# Patient Record
Sex: Male | Born: 1940 | Race: White | Hispanic: No | Marital: Married | Smoking: Former smoker
Health system: Southern US, Community
[De-identification: ages and names within clinical notes are randomized; demographics above are authoritative.]

## PROBLEM LIST (undated history)

## (undated) DIAGNOSIS — F419 Anxiety disorder, unspecified: Secondary | ICD-10-CM

## (undated) DIAGNOSIS — N189 Chronic kidney disease, unspecified: Secondary | ICD-10-CM

## (undated) DIAGNOSIS — C801 Malignant (primary) neoplasm, unspecified: Secondary | ICD-10-CM

## (undated) DIAGNOSIS — N400 Enlarged prostate without lower urinary tract symptoms: Secondary | ICD-10-CM

## (undated) DIAGNOSIS — F32A Depression, unspecified: Secondary | ICD-10-CM

## (undated) DIAGNOSIS — E785 Hyperlipidemia, unspecified: Secondary | ICD-10-CM

## (undated) DIAGNOSIS — K219 Gastro-esophageal reflux disease without esophagitis: Secondary | ICD-10-CM

## (undated) DIAGNOSIS — I10 Essential (primary) hypertension: Secondary | ICD-10-CM

## (undated) DIAGNOSIS — F329 Major depressive disorder, single episode, unspecified: Secondary | ICD-10-CM

## (undated) DIAGNOSIS — M1990 Unspecified osteoarthritis, unspecified site: Secondary | ICD-10-CM

## (undated) DIAGNOSIS — R55 Syncope and collapse: Secondary | ICD-10-CM

## (undated) HISTORY — PX: ESOPHAGEAL DILATION: SHX303

## (undated) HISTORY — DX: Malignant (primary) neoplasm, unspecified: C80.1

## (undated) HISTORY — PX: HERNIA REPAIR: SHX51

## (undated) HISTORY — PX: UPPER GASTROINTESTINAL ENDOSCOPY: SHX188

## (undated) HISTORY — PX: CHOLECYSTECTOMY: SHX55

## (undated) HISTORY — DX: Chronic kidney disease, unspecified: N18.9

## (undated) HISTORY — PX: POLYPECTOMY: SHX149

## (undated) HISTORY — DX: Anxiety disorder, unspecified: F41.9

## (undated) HISTORY — DX: Syncope and collapse: R55

## (undated) HISTORY — DX: Unspecified osteoarthritis, unspecified site: M19.90

## (undated) HISTORY — DX: Depression, unspecified: F32.A

---
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
## 1984-09-03 HISTORY — PX: KIDNEY STONE SURGERY: SHX686

## 2017-10-16 HISTORY — PX: ESOPHAGOGASTRODUODENOSCOPY: SHX1529

## 2017-12-20 ENCOUNTER — Encounter (HOSPITAL_COMMUNITY): Payer: Self-pay | Admitting: Internal Medicine

## 2017-12-20 ENCOUNTER — Emergency Department (HOSPITAL_COMMUNITY): Payer: Medicare Other

## 2017-12-20 ENCOUNTER — Inpatient Hospital Stay (HOSPITAL_COMMUNITY)
Admission: EM | Admit: 2017-12-20 | Discharge: 2017-12-22 | DRG: 641 | Disposition: A | Discharge status: Home / Self Care | Payer: Medicare Other | Attending: Family Medicine | Admitting: Family Medicine

## 2017-12-20 ENCOUNTER — Other Ambulatory Visit: Payer: Self-pay

## 2017-12-20 DIAGNOSIS — E860 Dehydration: Principal | ICD-10-CM | POA: Diagnosis present

## 2017-12-20 DIAGNOSIS — R197 Diarrhea, unspecified: Secondary | ICD-10-CM | POA: Diagnosis not present

## 2017-12-20 DIAGNOSIS — N400 Enlarged prostate without lower urinary tract symptoms: Secondary | ICD-10-CM | POA: Diagnosis present

## 2017-12-20 DIAGNOSIS — Z87442 Personal history of urinary calculi: Secondary | ICD-10-CM

## 2017-12-20 DIAGNOSIS — K521 Toxic gastroenteritis and colitis: Secondary | ICD-10-CM | POA: Diagnosis not present

## 2017-12-20 DIAGNOSIS — Y92009 Unspecified place in unspecified non-institutional (private) residence as the place of occurrence of the external cause: Secondary | ICD-10-CM | POA: Diagnosis not present

## 2017-12-20 DIAGNOSIS — K219 Gastro-esophageal reflux disease without esophagitis: Secondary | ICD-10-CM | POA: Diagnosis present

## 2017-12-20 DIAGNOSIS — E871 Hypo-osmolality and hyponatremia: Secondary | ICD-10-CM | POA: Diagnosis not present

## 2017-12-20 DIAGNOSIS — R112 Nausea with vomiting, unspecified: Secondary | ICD-10-CM | POA: Diagnosis not present

## 2017-12-20 DIAGNOSIS — E876 Hypokalemia: Secondary | ICD-10-CM | POA: Diagnosis not present

## 2017-12-20 DIAGNOSIS — I10 Essential (primary) hypertension: Secondary | ICD-10-CM | POA: Diagnosis present

## 2017-12-20 DIAGNOSIS — A770 Spotted fever due to Rickettsia rickettsii: Secondary | ICD-10-CM | POA: Diagnosis not present

## 2017-12-20 DIAGNOSIS — R55 Syncope and collapse: Secondary | ICD-10-CM | POA: Diagnosis not present

## 2017-12-20 DIAGNOSIS — Z9049 Acquired absence of other specified parts of digestive tract: Secondary | ICD-10-CM | POA: Diagnosis not present

## 2017-12-20 DIAGNOSIS — T364X5A Adverse effect of tetracyclines, initial encounter: Secondary | ICD-10-CM | POA: Diagnosis present

## 2017-12-20 DIAGNOSIS — Z87891 Personal history of nicotine dependence: Secondary | ICD-10-CM

## 2017-12-20 DIAGNOSIS — Z8249 Family history of ischemic heart disease and other diseases of the circulatory system: Secondary | ICD-10-CM

## 2017-12-20 HISTORY — DX: Benign prostatic hyperplasia without lower urinary tract symptoms: N40.0

## 2017-12-20 HISTORY — DX: Hyperlipidemia, unspecified: E78.5

## 2017-12-20 HISTORY — DX: Gastro-esophageal reflux disease without esophagitis: K21.9

## 2017-12-20 HISTORY — DX: Essential (primary) hypertension: I10

## 2017-12-20 LAB — CBC WITH DIFFERENTIAL/PLATELET
Basophils Absolute: 0 10*3/uL (ref 0.0–0.1)
Basophils Relative: 0 %
EOS PCT: 0 %
Eosinophils Absolute: 0 10*3/uL (ref 0.0–0.7)
HEMATOCRIT: 35.5 % — AB (ref 39.0–52.0)
HEMOGLOBIN: 12.5 g/dL — AB (ref 13.0–17.0)
LYMPHS ABS: 0.4 10*3/uL — AB (ref 0.7–4.0)
LYMPHS PCT: 3 %
MCH: 30.6 pg (ref 26.0–34.0)
MCHC: 35.2 g/dL (ref 30.0–36.0)
MCV: 87 fL (ref 78.0–100.0)
Monocytes Absolute: 0.5 10*3/uL (ref 0.1–1.0)
Monocytes Relative: 4 %
NEUTROS ABS: 11.4 10*3/uL — AB (ref 1.7–7.7)
Neutrophils Relative %: 93 %
PLATELETS: 194 10*3/uL (ref 150–400)
RBC: 4.08 MIL/uL — AB (ref 4.22–5.81)
RDW: 12.1 % (ref 11.5–15.5)
WBC: 12.3 10*3/uL — AB (ref 4.0–10.5)

## 2017-12-20 LAB — COMPREHENSIVE METABOLIC PANEL
ALBUMIN: 3.2 g/dL — AB (ref 3.5–5.0)
ALT: 17 U/L (ref 17–63)
ANION GAP: 11 (ref 5–15)
AST: 20 U/L (ref 15–41)
Alkaline Phosphatase: 47 U/L (ref 38–126)
BUN: 13 mg/dL (ref 6–20)
CHLORIDE: 100 mmol/L — AB (ref 101–111)
CO2: 18 mmol/L — ABNORMAL LOW (ref 22–32)
Calcium: 7.8 mg/dL — ABNORMAL LOW (ref 8.9–10.3)
Creatinine, Ser: 0.91 mg/dL (ref 0.61–1.24)
GFR calc Af Amer: >60 mL/min (ref >60)
Glucose, Bld: 156 mg/dL — ABNORMAL HIGH (ref 65–99)
POTASSIUM: 3 mmol/L — AB (ref 3.5–5.1)
Sodium: 129 mmol/L — ABNORMAL LOW (ref 135–145)
Total Bilirubin: 1.5 mg/dL — ABNORMAL HIGH (ref 0.3–1.2)
Total Protein: 5.5 g/dL — ABNORMAL LOW (ref 6.5–8.1)

## 2017-12-20 LAB — MAGNESIUM: Magnesium: 1.5 mg/dL — ABNORMAL LOW (ref 1.7–2.4)

## 2017-12-20 LAB — I-STAT TROPONIN, ED: TROPONIN I, POC: 0 ng/mL (ref 0.00–0.08)

## 2017-12-20 LAB — POC OCCULT BLOOD, ED: FECAL OCCULT BLD: NEGATIVE

## 2017-12-20 MED ORDER — PANTOPRAZOLE SODIUM 40 MG PO TBEC
40.0000 mg | DELAYED_RELEASE_TABLET | Freq: Every day | ORAL | Status: DC
Start: 2017-12-21 — End: 2017-12-22
  Administered 2017-12-21 – 2017-12-22 (×2): 40 mg via ORAL
  Filled 2017-12-20 (×2): qty 1

## 2017-12-20 MED ORDER — METOCLOPRAMIDE HCL 5 MG/ML IJ SOLN
5.0000 mg | Freq: Once | INTRAMUSCULAR | Status: AC
  Administered 2017-12-20: 5 mg via INTRAVENOUS
  Filled 2017-12-20: qty 2

## 2017-12-20 MED ORDER — ENOXAPARIN SODIUM 40 MG/0.4ML ~~LOC~~ SOLN
40.0000 mg | SUBCUTANEOUS | Status: DC
  Administered 2017-12-20 – 2017-12-21 (×2): 40 mg via SUBCUTANEOUS
  Filled 2017-12-20 (×3): qty 0.4

## 2017-12-20 MED ORDER — SODIUM CHLORIDE 0.9 % IV SOLN
100.0000 mg | Freq: Two times a day (BID) | INTRAVENOUS | Status: DC
  Administered 2017-12-20 – 2017-12-22 (×4): 100 mg via INTRAVENOUS
  Filled 2017-12-20 (×5): qty 100

## 2017-12-20 MED ORDER — SODIUM CHLORIDE 0.9 % IV BOLUS
2000.0000 mL | Freq: Once | INTRAVENOUS | Status: AC
Start: 2017-12-20 — End: 2017-12-20
  Administered 2017-12-20: 2000 mL via INTRAVENOUS

## 2017-12-20 MED ORDER — PROMETHAZINE HCL 25 MG/ML IJ SOLN
12.5000 mg | Freq: Three times a day (TID) | INTRAMUSCULAR | Status: DC | PRN
  Filled 2017-12-20: qty 1

## 2017-12-20 MED ORDER — AMLODIPINE BESYLATE 10 MG PO TABS
10.0000 mg | ORAL_TABLET | Freq: Every day | ORAL | Status: DC
  Administered 2017-12-21 – 2017-12-22 (×2): 10 mg via ORAL
  Filled 2017-12-20 (×2): qty 1

## 2017-12-20 MED ORDER — LORAZEPAM 2 MG/ML IJ SOLN: 1.0000 mg | Freq: Once | INTRAMUSCULAR | Status: DC

## 2017-12-20 MED ORDER — SODIUM CHLORIDE 0.9% FLUSH
3.0000 mL | Freq: Two times a day (BID) | INTRAVENOUS | Status: DC
  Administered 2017-12-21 – 2017-12-22 (×3): 3 mL via INTRAVENOUS

## 2017-12-20 MED ORDER — DUTASTERIDE 0.5 MG PO CAPS
0.5000 mg | ORAL_CAPSULE | Freq: Every day | ORAL | Status: DC
  Administered 2017-12-21 – 2017-12-22 (×2): 0.5 mg via ORAL
  Filled 2017-12-20 (×2): qty 1

## 2017-12-20 MED ORDER — POTASSIUM CHLORIDE CRYS ER 20 MEQ PO TBCR
40.0000 meq | EXTENDED_RELEASE_TABLET | Freq: Once | ORAL | Status: AC
  Administered 2017-12-20: 40 meq via ORAL
  Filled 2017-12-20: qty 2

## 2017-12-20 MED ORDER — SODIUM CHLORIDE 0.9 % IV SOLN
INTRAVENOUS | Status: DC
  Administered 2017-12-20 – 2017-12-22 (×4): via INTRAVENOUS

## 2017-12-20 MED ORDER — ONDANSETRON HCL 4 MG/2ML IJ SOLN
4.0000 mg | Freq: Four times a day (QID) | INTRAMUSCULAR | Status: DC | PRN
  Administered 2017-12-21: 4 mg via INTRAVENOUS
  Filled 2017-12-20 (×2): qty 2

## 2017-12-20 NOTE — ED Notes (Signed)
Patient transported to X-ray 

## 2017-12-20 NOTE — Progress Notes (Signed)
Patient arrived to unit, wife at bedside. Patient complains of nausea and also feeling hungry. Gave patient snacks according to clear liquid diet. Patient on high/loe bed for safety. Updated on plan of care and safety measures in place.

## 2017-12-20 NOTE — ED Triage Notes (Signed)
Pt arived via [employer].Marland KitchenMarland Kitchen

## 2017-12-20 NOTE — ED Notes (Signed)
Patient transported to CT 

## 2017-12-20 NOTE — ED Notes (Signed)
Label sent to lab to run mg blood test

## 2017-12-20 NOTE — ED Provider Notes (Signed)
[facility] EMERGENCY DEPARTMENT Provider Note   CSN: [id number] Arrival date & time: 12/20/17  1506     History   Chief Complaint Chief Complaint  Patient presents with  . Shortness of Breath    HPI Zohaib Aquilino is a 77 y.o. male.  Level 5 caveat patient is extremely poor historian history is obtained from EMS and from patient's wife via phone  HPI patient complains of extreme dizziness meaning lightheadedness he has had one syncopal event and 2 near syncopal events today.  His wife reports that he has had diarrhea and vomiting for the past 2 days vomiting at least 5 times today and 3 episodes of diarrhea.  He complains of headache, diffuse presently which is worse with changing positions.  No known fever.  He was seen at [facility] emergency department this past week(day unclear the patient's wife and to pt) for dizziness and lightheadedness found to be dehydrated received treated with intravenous fluids.  Patient brought by EMS.  He had syncopal event in presence of EMS EMS performed rhythm strip which they determined to be ventricular tachycardia however rhythm strip appears to be artifact, with poor baseline and likely narrow complex bradycardia.  He was treated with 1 L of normal saline and received Zofran intravenously in the field.  His wife reports that he was seen by his primary care physician this past week as well and started on doxycycline for [facility] spotted fever.  He started doxycycline yesterday  No past medical history on file.  There are no active problems to display for this patient. Past medical history of hiatal hernia  Surgical history esophageal dilatation      Home Medications   Medications atenolol every 3 days, doxycycline Prior to Admission medications   Not on File    Family History No family history on file.  Social History Social History   Tobacco Use  . Smoking status: Not on file  Substance Use Topics  .  Alcohol use: Not on file  . Drug use: Not on file   Social history no tobacco no alcohol no drugs Allergies   Patient has no allergy information on record. No known drug allergies  Review of Systems Review of Systems  Unable to perform ROS: Other  Respiratory: Positive for shortness of breath.        Tachypnea  Gastrointestinal: Positive for diarrhea and vomiting.  Neurological: Positive for light-headedness and headaches.     Physical Exam Updated Vital Signs BP (!) 125/58   Pulse 81   Resp (!) 29   SpO2 100%   Physical Exam  Constitutional:  Ill-appearing very anxious  HENT:  Head: Normocephalic and atraumatic.  Mucous membranes dry  Eyes: Pupils are equal, round, and reactive to light. Conjunctivae are normal.  Neck: Neck supple. No tracheal deviation present. No thyromegaly present.  Cardiovascular: Normal rate and regular rhythm.  No murmur heard. Pulmonary/Chest: Effort normal and breath sounds normal.  Abdominal: Soft. Bowel sounds are normal. He exhibits no distension. There is no tenderness.  Genitourinary: Rectum normal and penis normal. Rectal exam shows guaiac negative stool.  Genitourinary Comments: Rectum normal tone brown stool no gross blood  Musculoskeletal: Normal range of motion. He exhibits no edema or tenderness.  Neurological: He is alert. Coordination normal.  Moves all extremities follows simple commands  Skin: Skin is warm and dry. No rash noted.  Psychiatric: He has a normal mood and affect.  Nursing note and vitals reviewed.  ED Treatments / Results  Labs (all labs ordered are listed, but only abnormal results are displayed) Labs Reviewed  COMPREHENSIVE METABOLIC PANEL  CBC WITH DIFFERENTIAL/PLATELET  POC OCCULT BLOOD, ED   Results for orders placed or performed during the hospital encounter of 12/20/17  Comprehensive metabolic panel  Result Value Ref Range   Sodium 129 (L) 135 - 145 mmol/L   Potassium 3.0 (L) 3.5 - 5.1 mmol/L     Chloride 100 (L) 101 - 111 mmol/L   CO2 18 (L) 22 - 32 mmol/L   Glucose, Bld 156 (H) 65 - 99 mg/dL   BUN 13 6 - 20 mg/dL   Creatinine, Ser 0.91 0.61 - 1.24 mg/dL   Calcium 7.8 (L) 8.9 - 10.3 mg/dL   Total Protein 5.5 (L) 6.5 - 8.1 g/dL   Albumin 3.2 (L) 3.5 - 5.0 g/dL   AST 20 15 - 41 U/L   ALT 17 17 - 63 U/L   Alkaline Phosphatase 47 38 - 126 U/L   Total Bilirubin 1.5 (H) 0.3 - 1.2 mg/dL   GFR calc non Af Amer >60 >60 mL/min   GFR calc Af Amer >60 >60 mL/min   Anion gap 11 5 - 15  CBC with Differential/Platelet  Result Value Ref Range   WBC 12.3 (H) 4.0 - 10.5 K/uL   RBC 4.08 (L) 4.22 - 5.81 MIL/uL   Hemoglobin 12.5 (L) 13.0 - 17.0 g/dL   HCT 35.5 (L) 39.0 - 52.0 %   MCV 87.0 78.0 - 100.0 fL   MCH 30.6 26.0 - 34.0 pg   MCHC 35.2 30.0 - 36.0 g/dL   RDW 12.1 11.5 - 15.5 %   Platelets 194 150 - 400 K/uL   Neutrophils Relative % 93 %   Neutro Abs 11.4 (H) 1.7 - 7.7 K/uL   Lymphocytes Relative 3 %   Lymphs Abs 0.4 (L) 0.7 - 4.0 K/uL   Monocytes Relative 4 %   Monocytes Absolute 0.5 0.1 - 1.0 K/uL   Eosinophils Relative 0 %   Eosinophils Absolute 0.0 0.0 - 0.7 K/uL   Basophils Relative 0 %   Basophils Absolute 0.0 0.0 - 0.1 K/uL  POC occult blood, ED Provider will collect  Result Value Ref Range   Fecal Occult Bld NEGATIVE NEGATIVE  I-stat troponin, ED  Result Value Ref Range   Troponin i, poc 0.00 0.00 - 0.08 ng/mL   Comment 3           Ct Head Wo Contrast  Result Date: 12/20/2017 CLINICAL DATA:  Syncope with a fall and blow to the head today. Initial encounter. EXAM: CT HEAD WITHOUT CONTRAST TECHNIQUE: Contiguous axial images were obtained from the base of the skull through the vertex without intravenous contrast. COMPARISON:  None. FINDINGS: Brain: No evidence of acute infarction, hemorrhage, hydrocephalus, extra-axial collection or mass lesion/mass effect. Vascular: No hyperdense vessel or unexpected calcification. Skull: Intact. Sinuses/Orbits: Negative. Other: None.  IMPRESSION: Normal head CT. Electronically Signed   By: Inge Rise M.D.   On: 12/20/2017 18:10   Dg Abd Acute W/chest  Result Date: 12/20/2017 CLINICAL DATA:  Vomiting, diarrhea, nausea. EXAM: DG ABDOMEN ACUTE W/ 1V CHEST COMPARISON:  None. FINDINGS: There is no evidence of dilated bowel loops or free intraperitoneal air. No radiopaque calculi or other significant radiographic abnormality is seen. Heart size and mediastinal contours are within normal limits. Both lungs are clear. IMPRESSION: No evidence of bowel obstruction or ileus. No acute cardiopulmonary disease. Electronically Signed  By: Marijo Conception, M.D.   On: 12/20/2017 16:47   EKG None  EKG Interpretation  Date/Time:  Friday December 20 2017 15:05:36 EDT Ventricular Rate:  80 PR Interval:    QRS Duration: 96 QT Interval:  435 QTC Calculation: 502 R Axis:   56 Text Interpretation:  Age not entered, assumed to be  77 years old for purpose of ECG interpretation Sinus rhythm Minimal ST depression, anterolateral leads Prolonged QT interval No old tracing to compare Confirmed by [place], Inocente Salles [phone]) on 12/20/2017 3:49:17 PM       Radiology No results found.  Procedures Procedures (including critical care time)  Medications Ordered in ED Medications  sodium chloride 0.9 % bolus 2,000 mL (has no administration in time range)  metoCLOPramide (REGLAN) injection 5 mg (has no administration in time range)     Initial Impression / Assessment and Plan / ED Course  I have reviewed the triage vital signs and the nursing notes.  Pertinent labs & imaging results that were available during my care of the patient were reviewed by me and considered in my medical decision making (see chart for details).   6P.m. he feels no improvement of nausea after with intravenous fluids and IV Reglan.  He now complains of severe sensation of room spinning and nausea worse when he moves his head.  IV lorazepam ordered.  7:10 PM he feels much  improved without further treatment.  He was not administered IV lorazepam.  He is alert and talkative. I suspect the patient had syncopal event secondary to dehydration and possibly vasovagal event.  His wife reports that he has syncopal events frequently, particularly when he vomits.  Vertigo appears to be peripheral.  Worse with moving his head with severe nausea.  No visual changes no speech changes.  Lab work is consistent with hypokalemia, and hyponatremia.  Consulted hospitalist  physician Dr. Alcario Drought  to arrange for overnight stay.  Dr. Alcario Drought will evaluate patient in the emergency department Final Clinical Impressions(s) / ED Diagnoses  Diagnoses #1 syncope #2 bad headache  #3 vertigo #4 nausea vomiting diarrhea #5 hyponatremia #6 hypokalemia  Final diagnoses:  None    ED Discharge Orders    None       Orlie Dakin, MD 12/20/17 2004

## 2017-12-20 NOTE — H&P (Signed)
History and Physical    Mason Stark BMW:[id number] DOB: [birth year] DOA: 12/20/2017  PCP: No primary care provider on file.  Patient coming from: Home  I have personally briefly reviewed patient's old medical records in [place]  Chief Complaint: Syncope, N/V/D  HPI: Mason Stark is a 77 y.o. male with medical history significant of HTN, HLD.  Had sinus headache and dizziness.   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  Started on doxycycline on 4/18, took first pill without problem but then developed N/V/D.  Today had syncopal episode at home and 2x near syncope.  Threw up third pill this AM.  Felt "flushed" but never had documented fever.  Has been bitten by a number of ticks throughout the years and gotten "injections" prophylacticly after bites.  But no tick bite he knows of in the past couple of weeks.  ED Course: ? borderline LVH on EKG.  CXR / KUB nl.  WBC 12.6k.  Sodium 129, K 3.0, CO2 18.  Given 2L NS and Reglan.  Hospitalist asked to obs.   Review of Systems: As per HPI otherwise 10 point review of systems negative.   Past Medical History:  Diagnosis Date  . BPH (benign prostatic hyperplasia)   . GERD (gastroesophageal reflux disease)   . HLD (hyperlipidemia)   . HTN (hypertension)     Past Surgical History:  Procedure Laterality Date  . CHOLECYSTECTOMY    . ESOPHAGEAL DILATION    . HERNIA REPAIR    . KIDNEY STONE SURGERY  2725   complications, ureteral injury, multiple surgeries were required     reports that he has quit smoking. He does not have any smokeless tobacco history on file. He reports that he does not drink alcohol or use drugs.  Allergies not on file  Family History  Problem Relation Age of Onset  . Heart failure Mother      Prior to Admission medications   Not on File    Physical Exam: Vitals:   12/20/17 1515 12/20/17 1530 12/20/17 1533 12/20/17 1600  BP: (!) 118/58 (!) 125/58  (!) 126/59  Pulse: 76 81   75  Resp: (!) 23 (!) 29  (!) 22  Temp:   97.6 F (36.4 C)   TempSrc:   Rectal   SpO2: 100% 100%  100%    Constitutional: NAD, calm, comfortable Eyes: PERRL, lids and conjunctivae normal ENMT: Mucous membranes are moist. Posterior pharynx clear of any exudate or lesions.Normal dentition.  Neck: normal, supple, no masses, no thyromegaly Respiratory: clear to auscultation bilaterally, no wheezing, no crackles. Normal respiratory effort. No accessory muscle use.  Cardiovascular: Regular rate and rhythm, no murmurs / rubs / gallops. No extremity edema. 2+ pedal pulses. No carotid bruits.  Abdomen: no tenderness, no masses palpated. No hepatosplenomegaly. Bowel sounds positive.  Musculoskeletal: no clubbing / cyanosis. No joint deformity upper and lower extremities. Good ROM, no contractures. Normal muscle tone.  Skin: no rashes, lesions, ulcers. No induration Neurologic: CN 2-12 grossly intact. Sensation intact, DTR normal. Strength 5/5 in all 4.  Psychiatric: Normal judgment and insight. Alert and oriented x 3. Normal mood.    Labs on Admission: I have personally reviewed following labs and imaging studies  CBC: Recent Labs  Lab 12/20/17 1733  WBC 12.3*  NEUTROABS 11.4*  HGB 12.5*  HCT 35.5*  MCV 87.0  PLT 366   Basic Metabolic Panel: Recent Labs  Lab 12/20/17 1733  NA 129*  K  3.0*  CL 100*  CO2 18*  GLUCOSE 156*  BUN 13  CREATININE 0.91  CALCIUM 7.8*   GFR: CrCl cannot be calculated (Unknown ideal weight.). Liver Function Tests: Recent Labs  Lab 12/20/17 1733  AST 20  ALT 17  ALKPHOS 47  BILITOT 1.5*  PROT 5.5*  ALBUMIN 3.2*   No results for input(s): LIPASE, AMYLASE in the last 168 hours. No results for input(s): AMMONIA in the last 168 hours. Coagulation Profile: No results for input(s): INR, PROTIME in the last 168 hours. Cardiac Enzymes: No results for input(s): CKTOTAL, CKMB, CKMBINDEX, TROPONINI in the last 168 hours. BNP (last 3 results) No  results for input(s): PROBNP in the last 8760 hours. HbA1C: No results for input(s): HGBA1C in the last 72 hours. CBG: No results for input(s): GLUCAP in the last 168 hours. Lipid Profile: No results for input(s): CHOL, HDL, LDLCALC, TRIG, CHOLHDL, LDLDIRECT in the last 72 hours. Thyroid Function Tests: No results for input(s): TSH, T4TOTAL, FREET4, T3FREE, THYROIDAB in the last 72 hours. Anemia Panel: No results for input(s): VITAMINB12, FOLATE, FERRITIN, TIBC, IRON, RETICCTPCT in the last 72 hours. Urine analysis: No results found for: COLORURINE, APPEARANCEUR, [place], [place], GLUCOSEU, HGBUR, BILIRUBINUR, KETONESUR, PROTEINUR, UROBILINOGEN, NITRITE, LEUKOCYTESUR  Radiological Exams on Admission: Ct Head Wo Contrast  Result Date: 12/20/2017 CLINICAL DATA:  Syncope with a fall and blow to the head today. Initial encounter. EXAM: CT HEAD WITHOUT CONTRAST TECHNIQUE: Contiguous axial images were obtained from the base of the skull through the vertex without intravenous contrast. COMPARISON:  None. FINDINGS: Brain: No evidence of acute infarction, hemorrhage, hydrocephalus, extra-axial collection or mass lesion/mass effect. Vascular: No hyperdense vessel or unexpected calcification. Skull: Intact. Sinuses/Orbits: Negative. Other: None. IMPRESSION: Normal head CT. Electronically Signed   By: Mason Stark M.D.   On: 12/20/2017 18:10   Dg Abd Acute W/chest  Result Date: 12/20/2017 CLINICAL DATA:  Vomiting, diarrhea, nausea. EXAM: DG ABDOMEN ACUTE W/ 1V CHEST COMPARISON:  None. FINDINGS: There is no evidence of dilated bowel loops or free intraperitoneal air. No radiopaque calculi or other significant radiographic abnormality is seen. Heart size and mediastinal contours are within normal limits. Both lungs are clear. IMPRESSION: No evidence of bowel obstruction or ileus. No acute cardiopulmonary disease. Electronically Signed   By: Mason Stark, M.D.   On: 12/20/2017 16:47    EKG:  Independently reviewed.  Assessment/Plan Principal Problem:   Syncope Active Problems:   Nausea vomiting and diarrhea   RMSF [facility] spotted fever)    1. Syncope - suspect dehydration 1. Syncope obs pathway 2. Tele monitor 3. Given EKG ? LVH, will get 2D echo 4. Replace K and hydrate with IV NS 5. Repeat BMP in AM 2. N/V/D - 1. Suspect side effect from PO doxycycline 2. Anti-emetics 3. Will switch to IV doxy during hospital stay 3. ? RMSF - 1. Curb sided Dr. Baxter Flattery 1. Obligated to treat positive titer even though it may very well be an IgG that doc got and represent past infection. 2. Could send IgG / IgM but by the time they got back in a week his treatment course would be about done. 3. She does suspect N/V/D is side effect of Doxycycline 4. Recs pre-treating with anti-emetic, doing IV in hospital 5. Recs NOT taking on empty stomach and pre-treating with anti-emetic when he is ready for discharge. 2. Only alternative med is chloramphenicol, which is associated with higher treatment mortality than doxy as well. 3. Unable to get  records from PCP, they are closed for good Friday and wife couldn't get in touch with anyone.  DVT prophylaxis: Lovenox Code Status: Full Family Communication: Wife at bedside Disposition Plan: Home after admit Consults called: Curb sided Dr. Baxter Flattery as above Admission status: Place in [place], [place] Hospital[facility] Pager [phone]  If 7AM-7PM, please contact day team taking care of patient [email or web address] Password [facility]  12/20/2017, 8:35 PM

## 2017-12-20 NOTE — Plan of Care (Signed)
  Problem: Education: Goal: Knowledge of General Education information will improve Outcome: Progressing   Problem: Health Behavior/Discharge Planning: Goal: Ability to manage health-related needs will improve Outcome: Progressing   Problem: Clinical Measurements: Goal: Diagnostic test results will improve Outcome: Progressing   Problem: Pain Managment: Goal: General experience of comfort will improve Outcome: Progressing

## 2017-12-21 ENCOUNTER — Observation Stay (HOSPITAL_COMMUNITY): Payer: Medicare Other

## 2017-12-21 DIAGNOSIS — R112 Nausea with vomiting, unspecified: Secondary | ICD-10-CM | POA: Diagnosis not present

## 2017-12-21 DIAGNOSIS — Z8249 Family history of ischemic heart disease and other diseases of the circulatory system: Secondary | ICD-10-CM | POA: Diagnosis not present

## 2017-12-21 DIAGNOSIS — Z87442 Personal history of urinary calculi: Secondary | ICD-10-CM | POA: Diagnosis not present

## 2017-12-21 DIAGNOSIS — T364X5A Adverse effect of tetracyclines, initial encounter: Secondary | ICD-10-CM | POA: Diagnosis not present

## 2017-12-21 DIAGNOSIS — R55 Syncope and collapse: Secondary | ICD-10-CM | POA: Diagnosis present

## 2017-12-21 DIAGNOSIS — K521 Toxic gastroenteritis and colitis: Secondary | ICD-10-CM | POA: Diagnosis not present

## 2017-12-21 DIAGNOSIS — Y92009 Unspecified place in unspecified non-institutional (private) residence as the place of occurrence of the external cause: Secondary | ICD-10-CM | POA: Diagnosis not present

## 2017-12-21 DIAGNOSIS — Z9049 Acquired absence of other specified parts of digestive tract: Secondary | ICD-10-CM | POA: Diagnosis not present

## 2017-12-21 DIAGNOSIS — I10 Essential (primary) hypertension: Secondary | ICD-10-CM | POA: Diagnosis not present

## 2017-12-21 DIAGNOSIS — K219 Gastro-esophageal reflux disease without esophagitis: Secondary | ICD-10-CM | POA: Diagnosis not present

## 2017-12-21 DIAGNOSIS — N400 Enlarged prostate without lower urinary tract symptoms: Secondary | ICD-10-CM | POA: Diagnosis not present

## 2017-12-21 DIAGNOSIS — A770 Spotted fever due to Rickettsia rickettsii: Secondary | ICD-10-CM | POA: Diagnosis not present

## 2017-12-21 DIAGNOSIS — E876 Hypokalemia: Secondary | ICD-10-CM | POA: Diagnosis not present

## 2017-12-21 DIAGNOSIS — E871 Hypo-osmolality and hyponatremia: Secondary | ICD-10-CM | POA: Diagnosis not present

## 2017-12-21 DIAGNOSIS — Z87891 Personal history of nicotine dependence: Secondary | ICD-10-CM | POA: Diagnosis not present

## 2017-12-21 DIAGNOSIS — E860 Dehydration: Secondary | ICD-10-CM | POA: Diagnosis not present

## 2017-12-21 LAB — CBC
HCT: 34.6 % — ABNORMAL LOW (ref 39.0–52.0)
Hemoglobin: 12.2 g/dL — ABNORMAL LOW (ref 13.0–17.0)
MCH: 31.3 pg (ref 26.0–34.0)
MCHC: 35.3 g/dL (ref 30.0–36.0)
MCV: 88.7 fL (ref 78.0–100.0)
PLATELETS: 187 10*3/uL (ref 150–400)
RBC: 3.9 MIL/uL — ABNORMAL LOW (ref 4.22–5.81)
RDW: 12.6 % (ref 11.5–15.5)
WBC: 10.6 10*3/uL — AB (ref 4.0–10.5)

## 2017-12-21 LAB — BASIC METABOLIC PANEL
Anion gap: 8 (ref 5–15)
BUN: 10 mg/dL (ref 6–20)
CHLORIDE: 104 mmol/L (ref 101–111)
CO2: 21 mmol/L — AB (ref 22–32)
Calcium: 7.9 mg/dL — ABNORMAL LOW (ref 8.9–10.3)
Creatinine, Ser: 0.89 mg/dL (ref 0.61–1.24)
GFR calc non Af Amer: >60 mL/min (ref >60)
Glucose, Bld: 105 mg/dL — ABNORMAL HIGH (ref 65–99)
POTASSIUM: 3.1 mmol/L — AB (ref 3.5–5.1)
SODIUM: 133 mmol/L — AB (ref 135–145)

## 2017-12-21 LAB — GLUCOSE, CAPILLARY: GLUCOSE-CAPILLARY: 97 mg/dL (ref 65–99)

## 2017-12-21 MED ORDER — POTASSIUM CHLORIDE CRYS ER 20 MEQ PO TBCR
40.0000 meq | EXTENDED_RELEASE_TABLET | Freq: Once | ORAL | Status: AC
  Administered 2017-12-21: 40 meq via ORAL
  Filled 2017-12-21: qty 2

## 2017-12-21 MED ORDER — CALCIUM CARBONATE-VITAMIN D 500-200 MG-UNIT PO TABS
2.0000 | ORAL_TABLET | Freq: Every day | ORAL | Status: DC
  Administered 2017-12-21 – 2017-12-22 (×2): 2 via ORAL
  Filled 2017-12-21 (×2): qty 2

## 2017-12-21 MED ORDER — ACETAMINOPHEN 325 MG PO TABS
650.0000 mg | ORAL_TABLET | Freq: Four times a day (QID) | ORAL | Status: DC | PRN
  Filled 2017-12-21: qty 2

## 2017-12-21 NOTE — Progress Notes (Addendum)
Pt stable, vitals stable, sitting in a recliner without SOB and distress, will continue to monitor RN talked with MD regarding his abnormal electrolytes, K was replaced via oral, For Mg MD said to keep watching and will reevaluated via another lab work  [place], Therapist, sports

## 2017-12-21 NOTE — Progress Notes (Signed)
Patient and wife are upset about medications that were not ordered or given during day shift. Questioned night RN about receiving doxycyline- wondering if he would get it. Wife states she is very aggravated, speaking in loud volume, and visibly stressed. She states she has many complaints about patient not receiving his meds and communication between MD. RN gave night meds and tried to address other concerns.

## 2017-12-21 NOTE — Progress Notes (Signed)
PROGRESS NOTE  Mason Stark LFY:[id number] DOB: 18-Jun-1941 DOA: 12/20/2017 PCP: No primary care provider on file.  HPI/Recap of past 24 hours: Patient seen at bedside with his wife in the room.  Nausea and vomiting is improved he has not had any vomiting but still feeling nauseous and stated his has some epigastric burning he is supposed to be on PPI.  Still feels tired.  Assessment/Plan: Principal Problem:   Syncope Active Problems:   Nausea vomiting and diarrhea   RMSF [facility] spotted fever)   Code Status: full   Family Communication: wife  Disposition Plan: Change to inpatient since he is still having nausea most likely from the p.o. doxycycline and requiring IV doxycycline   Consultants:  none   Procedures: non Antimicrobials:  IV doxycycline  DVT prophylaxis: Lovenox   Objective: Vitals:   12/20/17 2201 12/20/17 2353 12/21/17 0347 12/21/17 1243  BP: 139/64 (!) 111/53 (!) 130/59 128/60  Pulse: 77 65 77 63  Resp: 18 18 18 20   Temp: 98.1 F (36.7 C) 98 F (36.7 C) 98.1 F (36.7 C) 98 F (36.7 C)  TempSrc: Oral Oral Oral Oral  SpO2: 99% 98% 98% 97%  Weight: 73.5 kg (162 lb)  73.4 kg (161 lb 12.8 oz)   Height: 5\' 8"  (1.727 m)       Intake/Output Summary (Last 24 hours) at 12/21/2017 1449 Last data filed at 12/21/2017 1300 Gross per 24 hour  Intake 4083.33 ml  Output 1500 ml  Net 2583.33 ml   Filed Weights   12/20/17 2201 12/21/17 0347  Weight: 73.5 kg (162 lb) 73.4 kg (161 lb 12.8 oz)   Body mass index is 24.6 kg/m.  Exam:   General: Well-nourished  Cardiovascular: Regular rate and rhythm no murmur  Respiratory: CTA   Abdomen: Soft nontender normal active bowel sounds, no hepatosplenomegaly  Musculoskeletal: Ambulatory  Skin: Warm and moist  Psychiatry: Alert and oriented x3, congruent   Data Reviewed: CBC: Recent Labs  Lab 12/20/17 1733 12/21/17 0456  WBC 12.3* 10.6*  NEUTROABS 11.4*  --   HGB 12.5* 12.2*  HCT 35.5*  34.6*  MCV 87.0 88.7  PLT 194 852   Basic Metabolic Panel: Recent Labs  Lab 12/20/17 1733 12/20/17 2006 12/21/17 0456  NA 129*  --  133*  K 3.0*  --  3.1*  CL 100*  --  104  CO2 18*  --  21*  GLUCOSE 156*  --  105*  BUN 13  --  10  CREATININE 0.91  --  0.89  CALCIUM 7.8*  --  7.9*  MG  --  1.5*  --    GFR: Estimated Creatinine Clearance: 69.4 mL/min (by C-G formula based on SCr of 0.89 mg/dL). Liver Function Tests: Recent Labs  Lab 12/20/17 1733  AST 20  ALT 17  ALKPHOS 47  BILITOT 1.5*  PROT 5.5*  ALBUMIN 3.2*   No results for input(s): LIPASE, AMYLASE in the last 168 hours. No results for input(s): AMMONIA in the last 168 hours. Coagulation Profile: No results for input(s): INR, PROTIME in the last 168 hours. Cardiac Enzymes: No results for input(s): CKTOTAL, CKMB, CKMBINDEX, TROPONINI in the last 168 hours. BNP (last 3 results) No results for input(s): PROBNP in the last 8760 hours. HbA1C: No results for input(s): HGBA1C in the last 72 hours. CBG: Recent Labs  Lab 12/21/17 0610  GLUCAP 97   Lipid Profile: No results for input(s): CHOL, HDL, LDLCALC, TRIG, CHOLHDL, LDLDIRECT in the last 72  hours. Thyroid Function Tests: No results for input(s): TSH, T4TOTAL, FREET4, T3FREE, THYROIDAB in the last 72 hours. Anemia Panel: No results for input(s): VITAMINB12, FOLATE, FERRITIN, TIBC, IRON, RETICCTPCT in the last 72 hours. Urine analysis: No results found for: COLORURINE, APPEARANCEUR, LABSPEC, PHURINE, GLUCOSEU, HGBUR, BILIRUBINUR, KETONESUR, PROTEINUR, UROBILINOGEN, NITRITE, LEUKOCYTESUR Sepsis Labs: @LABRCNTIP (procalcitonin:4,lacticidven:4)  )No results found for this or any previous visit (from the past 240 hour(s)).    Studies: Ct Head Wo Contrast  Result Date: 12/20/2017 CLINICAL DATA:  Syncope with a fall and blow to the head today. Initial encounter. EXAM: CT HEAD WITHOUT CONTRAST TECHNIQUE: Contiguous axial images were obtained from the base of  the skull through the vertex without intravenous contrast. COMPARISON:  None. FINDINGS: Brain: No evidence of acute infarction, hemorrhage, hydrocephalus, extra-axial collection or mass lesion/mass effect. Vascular: No hyperdense vessel or unexpected calcification. Skull: Intact. Sinuses/Orbits: Negative. Other: None. IMPRESSION: Normal head CT. Electronically Signed   By: Inge Rise M.D.   On: 12/20/2017 18:10   Dg Abd Acute W/chest  Result Date: 12/20/2017 CLINICAL DATA:  Vomiting, diarrhea, nausea. EXAM: DG ABDOMEN ACUTE W/ 1V CHEST COMPARISON:  None. FINDINGS: There is no evidence of dilated bowel loops or free intraperitoneal air. No radiopaque calculi or other significant radiographic abnormality is seen. Heart size and mediastinal contours are within normal limits. Both lungs are clear. IMPRESSION: No evidence of bowel obstruction or ileus. No acute cardiopulmonary disease. Electronically Signed   By: Marijo Conception, M.D.   On: 12/20/2017 16:47    Scheduled Meds: . amLODipine  10 mg Oral Daily  . calcium-vitamin D  2 tablet Oral Q breakfast  . dutasteride  0.5 mg Oral Daily  . enoxaparin (LOVENOX) injection  40 mg Subcutaneous Q24H  . pantoprazole  40 mg Oral Daily  . sodium chloride flush  3 mL Intravenous Q12H    Continuous Infusions: . sodium chloride 100 mL/hr at 12/21/17 0427  . doxycycline (VIBRAMYCIN) IV Stopped (12/21/17 1101)     LOS: 0 days   Assessment/Plan Principal Problem:   Syncope Active Problems:   Nausea vomiting and diarrhea   RMSF [facility] spotted fever)    1. Syncope - suspect dehydration 1. Syncope  2. Tele monitor 3. Given EKG ? LVH, will get 2D echo pending 4. Replace K and hydrate with IV NS 5. Repeat BMP in AM 2. N/V/D - 1. Suspect side effect from PO doxycycline 2. Anti-emetics 3. switched to IV doxy during hospital stay 3. ? RMSF - 1. Continue IV doxycycline.    Cristal Deer, MD [facility]  To reach me or  the doctor on call, go to: [email or web address] Password [facility]  12/21/2017, 2:49 PM

## 2017-12-22 ENCOUNTER — Inpatient Hospital Stay (HOSPITAL_COMMUNITY): Payer: Medicare Other

## 2017-12-22 DIAGNOSIS — E860 Dehydration: Secondary | ICD-10-CM | POA: Diagnosis not present

## 2017-12-22 DIAGNOSIS — R55 Syncope and collapse: Secondary | ICD-10-CM

## 2017-12-22 LAB — COMPREHENSIVE METABOLIC PANEL
ALBUMIN: 3.4 g/dL — AB (ref 3.5–5.0)
ALK PHOS: 53 U/L (ref 38–126)
ALT: 19 U/L (ref 17–63)
ANION GAP: 7 (ref 5–15)
AST: 44 U/L — AB (ref 15–41)
BUN: 7 mg/dL (ref 6–20)
CALCIUM: 8.6 mg/dL — AB (ref 8.9–10.3)
CO2: 23 mmol/L (ref 22–32)
CREATININE: 0.86 mg/dL (ref 0.61–1.24)
Chloride: 108 mmol/L (ref 101–111)
GFR calc Af Amer: >60 mL/min (ref >60)
GFR calc non Af Amer: >60 mL/min (ref >60)
GLUCOSE: 105 mg/dL — AB (ref 65–99)
Potassium: 3.9 mmol/L (ref 3.5–5.1)
Sodium: 138 mmol/L (ref 135–145)
Total Bilirubin: 1.3 mg/dL — ABNORMAL HIGH (ref 0.3–1.2)
Total Protein: 5.8 g/dL — ABNORMAL LOW (ref 6.5–8.1)

## 2017-12-22 LAB — GLUCOSE, CAPILLARY: Glucose-Capillary: 85 mg/dL (ref 65–99)

## 2017-12-22 LAB — CBC WITH DIFFERENTIAL/PLATELET
BASOS ABS: 0 10*3/uL (ref 0.0–0.1)
BASOS PCT: 0 %
EOS ABS: 0 10*3/uL (ref 0.0–0.7)
Eosinophils Relative: 1 %
HCT: 39.6 % (ref 39.0–52.0)
HEMOGLOBIN: 13.7 g/dL (ref 13.0–17.0)
Lymphocytes Relative: 19 %
Lymphs Abs: 1.3 10*3/uL (ref 0.7–4.0)
MCH: 31.4 pg (ref 26.0–34.0)
MCHC: 34.6 g/dL (ref 30.0–36.0)
MCV: 90.6 fL (ref 78.0–100.0)
Monocytes Absolute: 0.7 10*3/uL (ref 0.1–1.0)
Monocytes Relative: 10 %
NEUTROS PCT: 70 %
Neutro Abs: 5 10*3/uL (ref 1.7–7.7)
Platelets: 202 10*3/uL (ref 150–400)
RBC: 4.37 MIL/uL (ref 4.22–5.81)
RDW: 12.8 % (ref 11.5–15.5)
WBC: 7 10*3/uL (ref 4.0–10.5)

## 2017-12-22 LAB — ECHOCARDIOGRAM COMPLETE
HEIGHTINCHES: 68 in
Weight: 2563.2 oz

## 2017-12-22 MED ORDER — ONDANSETRON HCL 4 MG PO TABS: 4.0000 mg | ORAL_TABLET | Freq: Two times a day (BID) | ORAL | 0 refills | Status: AC | PRN

## 2017-12-22 MED ORDER — ONDANSETRON HCL 4 MG PO TABS: 4.0000 mg | ORAL_TABLET | Freq: Two times a day (BID) | ORAL | 0 refills | Status: DC | PRN

## 2017-12-22 NOTE — Progress Notes (Signed)
Pt got discharged to home, discharge instructions provided and patient showed understanding to it, IV taken out,Telemonitor DC,pt left unit in wheelchair with all of the belongings accompanied with a family member (wife)  Samar Dass,RN 

## 2017-12-22 NOTE — Progress Notes (Signed)
Patient had concerns about IV antibiotic making him nauseous, the medicine hasn't made him nauseous, but patients was just very anxious and worried about becoming nauseated again. Patient hasn't had any nausea so far, will continue to monitor.

## 2017-12-22 NOTE — Progress Notes (Signed)
Pt said that his hiatal hernia medicine is missing, nobody has given him anything since yesterday, RN explained he is getting Protonix here instead of omeprazole starting from yesterday, RN explained both patient and family regarding each of the medicine he is getting and why he is getting, will continue to monitor  Palma Holter, RN

## 2017-12-22 NOTE — Discharge Summary (Addendum)
Discharge Summary  Mason Stark JJK:[id number] DOB: [birth year]  PCP: No primary care provider on file.  Admit date: 12/20/2017 Discharge date: 12/22/2017  Time spent: 36minutes  Recommendations for Outpatient Follow-up:  1. pcp in 1 week  Discharge Diagnoses:  Active Hospital Problems   Diagnosis Date Noted  . Syncope 12/20/2017  . Nausea vomiting and diarrhea 12/20/2017  . RMSF [facility] spotted fever) 12/20/2017    Resolved Hospital Problems  No resolved problems to display.    Discharge Condition: improved   Diet recommendation: regular  Vitals:   12/22/17 0923 12/22/17 1225  BP: 120/60 (!) 161/77  Pulse: 60 91  Resp:  20  Temp: 98 F (36.7 C) 98.3 F (36.8 C)  SpO2: 98% 98%    History of present illness:   HPI: Mason Stark is a 77 y.o. male with medical history significant of HTN, HLD.  Had sinus headache and dizziness.   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  Started on doxycycline on 4/18, took first pill without problem but then developed N/V/D.  He had syncopal episode at home and 2x near syncope.  Threw up third pill..  Apparently he had taken the doxycycline on an empty stomach.  He also had some diarrhea with it he stated that 2 or 3 days prior to that he was seen by his doctor who told him he was dehydrated and gave him IV fluid so the early dehydration plus nausea and vomiting caused him to have dizziness and syncopy.  Patient and wife confirmed that in the past he has had a similar problem where he will pass out if he had nausea and vomiting, it has happened multiple times.  While in the hospital he was given IV doxycycline for 2 days ,he had his  fifth dose today.  He will complete his oral doxycycline he has been advised to make sure he takes it on a full stomach not with snacks but a full meal. He experienced hypokalemia of 3.1 and was replaced with potassium by mouth his potassium this morning is 3.6    Hospital  Course:  Principal Problem:   Syncope Active Problems:   Nausea vomiting and diarrhea   RMSF [facility] spotted fever) While in the hospital he was given IV doxycycline for 2 days ,he had his  fifth dose today.  He will complete his oral doxycycline he has been advised to make sure he takes it on a full stomach not with snacks but a full meal.   Procedures:  2D echo  Consultations:  ID  Discharge Exam: BP (!) 161/77 (BP Location: Right Arm)   Pulse 91   Temp 98.3 F (36.8 C) (Oral)   Resp 20   Ht 5\' 8"  (1.727 m)   Wt 72.7 kg (160 lb 3.2 oz)   SpO2 98%   BMI 24.36 kg/m    Exam:   General: Well-nourished  Cardiovascular: Regular rate and rhythm no murmur  Respiratory: CTA   Abdomen: Soft nontender normal active bowel sounds, no hepatosplenomegaly  Musculoskeletal: Ambulatory  Skin: Warm and moist  Psychiatry: Alert and oriented x3, congruent    Discharge Instructions You were cared for by a hospitalist during your hospital stay. If you have any questions about your discharge medications or the care you received while you were in the hospital after you are discharged, you can call the unit and asked to speak with the hospitalist on call if the hospitalist that took care  of you is not available. Once you are discharged, your primary care physician will handle any further medical issues. Please note that NO REFILLS for any discharge medications will be authorized once you are discharged, as it is imperative that you return to your primary care physician (or establish a relationship with a primary care physician if you do not have one) for your aftercare needs so that they can reassess your need for medications and monitor your lab values.  Discharge Instructions    Call MD for:  persistant nausea and vomiting   Complete by:  As directed    Diet - low sodium heart healthy   Complete by:  As directed    Discharge instructions   Complete by:  As directed    I  have explained to him that he needs to take his doxycycline with his meals that is breakfast and dinner, (on a full stomach not with snack but with  A real good meal to avoid nausea and vomiting.  He expressed understanding and that he can eat   Increase activity slowly   Complete by:  As directed      Allergies as of 12/22/2017   No Known Allergies     Medication List    STOP taking these medications   atenolol 25 MG tablet Commonly known as:  TENORMIN     TAKE these medications   amLODipine 10 MG tablet Commonly known as:  NORVASC Take 10 mg by mouth daily.   aspirin EC 81 MG tablet Take 81 mg by mouth daily.   doxycycline 100 MG capsule Commonly known as:  VIBRAMYCIN Take 100 mg by mouth every 12 (twelve) hours. 10 day course started 12/19/17 am   dutasteride 0.5 MG capsule Commonly known as:  AVODART Take 0.5 mg by mouth daily.   lisinopril-hydrochlorothiazide 20-12.5 MG tablet Commonly known as:  PRINZIDE,ZESTORETIC Take 1 tablet by mouth daily.   omeprazole 20 MG capsule Commonly known as:  PRILOSEC Take 20 mg by mouth daily.   ondansetron 4 MG tablet Commonly known as:  ZOFRAN Take 1 tablet (4 mg total) by mouth 2 (two) times daily as needed for up to 5 days for nausea or vomiting (if  nausea after taking doxycycline).      No Known Allergies    The results of significant diagnostics from this hospitalization (including imaging, microbiology, ancillary and laboratory) are listed below for reference.    Significant Diagnostic Studies: Ct Head Wo Contrast  Result Date: 12/20/2017 CLINICAL DATA:  Syncope with a fall and blow to the head today. Initial encounter. EXAM: CT HEAD WITHOUT CONTRAST TECHNIQUE: Contiguous axial images were obtained from the base of the skull through the vertex without intravenous contrast. COMPARISON:  None. FINDINGS: Brain: No evidence of acute infarction, hemorrhage, hydrocephalus, extra-axial collection or mass lesion/mass effect.  Vascular: No hyperdense vessel or unexpected calcification. Skull: Intact. Sinuses/Orbits: Negative. Other: None. IMPRESSION: Normal head CT. Electronically Signed   By: Inge Rise M.D.   On: 12/20/2017 18:10   Dg Abd Acute W/chest  Result Date: 12/20/2017 CLINICAL DATA:  Vomiting, diarrhea, nausea. EXAM: DG ABDOMEN ACUTE W/ 1V CHEST COMPARISON:  None. FINDINGS: There is no evidence of dilated bowel loops or free intraperitoneal air. No radiopaque calculi or other significant radiographic abnormality is seen. Heart size and mediastinal contours are within normal limits. Both lungs are clear. IMPRESSION: No evidence of bowel obstruction or ileus. No acute cardiopulmonary disease. Electronically Signed   By: Marijo Conception,  M.D.   On: 12/20/2017 16:47    Microbiology: No results found for this or any previous visit (from the past 240 hour(s)).   Labs: Basic Metabolic Panel: Recent Labs  Lab 12/20/17 1733 12/20/17 2006 12/21/17 0456 12/22/17 0759  NA 129*  --  133* 138  K 3.0*  --  3.1* 3.9  CL 100*  --  104 108  CO2 18*  --  21* 23  GLUCOSE 156*  --  105* 105*  BUN 13  --  10 7  CREATININE 0.91  --  0.89 0.86  CALCIUM 7.8*  --  7.9* 8.6*  MG  --  1.5*  --   --    Liver Function Tests: Recent Labs  Lab 12/20/17 1733 12/22/17 0759  AST 20 44*  ALT 17 19  ALKPHOS 47 53  BILITOT 1.5* 1.3*  PROT 5.5* 5.8*  ALBUMIN 3.2* 3.4*   No results for input(s): LIPASE, AMYLASE in the last 168 hours. No results for input(s): AMMONIA in the last 168 hours. CBC: Recent Labs  Lab 12/20/17 1733 12/21/17 0456 12/22/17 0759  WBC 12.3* 10.6* 7.0  NEUTROABS 11.4*  --  5.0  HGB 12.5* 12.2* 13.7  HCT 35.5* 34.6* 39.6  MCV 87.0 88.7 90.6  PLT 194 187 202   Cardiac Enzymes: No results for input(s): CKTOTAL, CKMB, CKMBINDEX, TROPONINI in the last 168 hours. BNP: BNP (last 3 results) No results for input(s): BNP in the last 8760 hours.  ProBNP (last 3 results) No results for  input(s): PROBNP in the last 8760 hours.  CBG: Recent Labs  Lab 12/21/17 0610 12/22/17 0622  GLUCAP 97 85    ------------------------------------------------------------------ 2 D ECHO  Left ventricle:  The cavity size was normal. Systolic function was normal. The estimated ejection fraction was in the range of 55% to 60%. Wall motion was normal; there were no regional wall motion abnormalities. The transmitral flow pattern was normal. The deceleration time of the early transmitral flow velocity was normal. The pulmonary vein flow pattern was normal. The tissue Doppler parameters were normal. Left ventricular diastolic function parameters were normal.  -------------------------------------------------------------------     Signed:  Cristal Deer, MD [facility] 12/22/2017, 1:35 PM

## 2017-12-22 NOTE — Plan of Care (Signed)
  Problem: Education: Goal: Knowledge of General Education information will improve Outcome: Progressing   Problem: Health Behavior/Discharge Planning: Goal: Ability to manage health-related needs will improve Outcome: Progressing   Problem: Coping: Goal: Level of anxiety will decrease Outcome: Progressing   

## 2017-12-22 NOTE — Progress Notes (Signed)
Patient complains of right arm swelling (same arm as IV with running infusion). RN assessed, elevated arm and called IV team to take a second look. IV team came, tried to insert another IV in opposite arm- unsuccessful. Current IV is patent without complications. Will continue to monitor.

## 2017-12-22 NOTE — Progress Notes (Signed)
  Echocardiogram Echocardiogram Transesophageal has been performed.  Darlina Sicilian M 12/22/2017, 10:45 AM

## 2019-05-05 ENCOUNTER — Telehealth: Payer: Self-pay | Admitting: Gastroenterology

## 2019-05-05 NOTE — Telephone Encounter (Signed)
Please review previous message and advise 

## 2019-05-05 NOTE — Telephone Encounter (Signed)
Trish Can you please try to get notes from [facility]

## 2019-05-06 NOTE — Telephone Encounter (Signed)
I have put these notes on your desk for review.

## 2019-05-07 NOTE — Telephone Encounter (Signed)
Colonoscopy 07/2016(PCF)-1 cm tubular adenoma status post polypectomy, moderate predominantly sigmoid diverticulosis.  Limited prep. Repeat in 3 years-07/2019 with a 2-day prep.

## 2019-05-18 NOTE — Telephone Encounter (Signed)
I have put recall in Epic.

## 2019-05-20 NOTE — Telephone Encounter (Signed)
Patient's wife notified of when patient is due for repeat colonoscopy 07/2019.

## 2019-08-24 ENCOUNTER — Encounter: Payer: Self-pay | Admitting: Gastroenterology

## 2020-03-11 ENCOUNTER — Encounter: Payer: Self-pay | Admitting: Gastroenterology

## 2020-04-21 ENCOUNTER — Ambulatory Visit (AMBULATORY_SURGERY_CENTER): Payer: Self-pay | Admitting: *Deleted

## 2020-04-21 ENCOUNTER — Other Ambulatory Visit: Payer: Self-pay

## 2020-04-21 VITALS — Ht 68.0 in | Wt 151.0 lb

## 2020-04-21 DIAGNOSIS — Z86010 Personal history of colonic polyps: Secondary | ICD-10-CM

## 2020-04-21 DIAGNOSIS — C801 Malignant (primary) neoplasm, unspecified: Secondary | ICD-10-CM

## 2020-04-21 MED ORDER — SUPREP BOWEL PREP KIT 17.5-3.13-1.6 GM/177ML PO SOLN: 1.0000 | Freq: Once | ORAL | 0 refills | Status: AC

## 2020-04-21 NOTE — Progress Notes (Addendum)
No egg or soy allergy known to patient  No issues with past sedation with any surgeries or procedures- shaking post op 1986 after kidney stone surgery -  no intubation problems in the past  No FH of Malignant Hyperthermia No diet pills per patient No home 02 use per patient  No blood thinners per patient   Pt states  issues with constipation - sat he has issues 2-4 x a week with hard stools uses Miralax PRN only- last colon prep Fair- pt states he never got a clear result with Prepopik- did a 2 day Suprep today   No A fib or A flutter  EMMI video to pt or via MyChart  COVID 19 guidelines implemented in [place] today with Pt and RN - wife in[place] with pt today-   cov vax x 2   [place]  Coupon given to pt in [place] today , Code to Pharmacy   Due to the COVID-19 pandemic we are asking patients to follow these guidelines. Please only bring one care partner. Please be aware that your care partner may wait in the car in the parking lot or if they feel like they will be too hot to wait in the car, they may wait in the lobby on the 4th floor. All care partners are required to wear a mask the entire time (we do not have any that we can provide them), they need to practice social distancing, and we will do a Covid check for all patient's and care partners when you arrive. Also we will check their temperature and your temperature. If the care partner waits in their car they need to stay in the parking lot the entire time and we will call them on their cell phone when the patient is ready for discharge so they can bring the car to the front of the building. Also all patient's will need to wear a mask into building.

## 2020-05-04 ENCOUNTER — Telehealth: Payer: Self-pay | Admitting: Gastroenterology

## 2020-05-04 ENCOUNTER — Encounter: Payer: Medicare Other | Admitting: Gastroenterology

## 2020-05-04 DIAGNOSIS — R55 Syncope and collapse: Secondary | ICD-10-CM | POA: Diagnosis not present

## 2020-05-04 NOTE — Telephone Encounter (Signed)
Patient did not tolerate bowel prep. He was vomiting and having diarrhea, does not feel he can tolerate the second half of prep.  According to wife he passes out frequently and she has taken him to ER on multiple occasions, he has passed out just before the phone call and was on the toilet floor. He didn't want her to call 911 or come to ER, wants to proceed with colonoscopy.  Advised her to call 911 if his condition worsens, have him drinks plenty of clear fluids and if he is able to tolerate liquids consider miralax prep . Provided instructions and they do have Miralax at home. Merrie Roof, please ask admitting RN to check on him in the AM.

## 2020-05-04 NOTE — Telephone Encounter (Signed)
Thanks for taking care of him RG 

## 2020-05-04 NOTE — Telephone Encounter (Signed)
Will contact patient on Friday 05/06/20

## 2020-05-04 NOTE — Telephone Encounter (Signed)
I talked to the patient's wife. He is doing much better. We are adjusting his blood pressure medications  Claiborne Billings, Can you please call them on Friday to make sure he is doing okay RG

## 2020-05-06 ENCOUNTER — Telehealth: Payer: Self-pay

## 2020-05-06 NOTE — Telephone Encounter (Signed)
Spoke to patients wife this morning to see how he was doing after going to the hospital on 05/04/20. The wife states that patient is doing much better. She appreciated the call.

## 2020-05-06 NOTE — Telephone Encounter (Signed)
Spoke to patients wife this morning to see how he was doing after going to the hospital on 02/02/20. The wife states that patient is doing much better. She appreciated the call.

## 2021-11-01 ENCOUNTER — Emergency Department (HOSPITAL_BASED_OUTPATIENT_CLINIC_OR_DEPARTMENT_OTHER)
Admission: EM | Admit: 2021-11-01 | Discharge: 2021-11-01 | Disposition: A | Discharge status: Home / Self Care | Payer: Medicare PPO | Attending: Emergency Medicine | Admitting: Emergency Medicine

## 2021-11-01 ENCOUNTER — Encounter (HOSPITAL_BASED_OUTPATIENT_CLINIC_OR_DEPARTMENT_OTHER): Payer: Self-pay | Admitting: Urology

## 2021-11-01 ENCOUNTER — Other Ambulatory Visit: Payer: Self-pay

## 2021-11-01 DIAGNOSIS — N189 Chronic kidney disease, unspecified: Secondary | ICD-10-CM | POA: Diagnosis not present

## 2021-11-01 DIAGNOSIS — Z79899 Other long term (current) drug therapy: Secondary | ICD-10-CM | POA: Diagnosis not present

## 2021-11-01 DIAGNOSIS — I129 Hypertensive chronic kidney disease with stage 1 through stage 4 chronic kidney disease, or unspecified chronic kidney disease: Secondary | ICD-10-CM | POA: Insufficient documentation

## 2021-11-01 DIAGNOSIS — Z7982 Long term (current) use of aspirin: Secondary | ICD-10-CM | POA: Diagnosis not present

## 2021-11-01 DIAGNOSIS — R300 Dysuria: Secondary | ICD-10-CM | POA: Diagnosis not present

## 2021-11-01 DIAGNOSIS — Z86010 Personal history of colonic polyps: Secondary | ICD-10-CM | POA: Diagnosis not present

## 2021-11-01 DIAGNOSIS — R39198 Other difficulties with micturition: Secondary | ICD-10-CM | POA: Diagnosis not present

## 2021-11-01 LAB — URINALYSIS, ROUTINE W REFLEX MICROSCOPIC
Bilirubin Urine: NEGATIVE
Glucose, UA: NEGATIVE mg/dL
Hgb urine dipstick: NEGATIVE
Ketones, ur: NEGATIVE mg/dL
Leukocytes,Ua: NEGATIVE
Nitrite: NEGATIVE
Protein, ur: NEGATIVE mg/dL
Specific Gravity, Urine: 1.015 (ref 1.005–1.030)
pH: 7.5 (ref 5.0–8.0)

## 2021-11-01 MED ORDER — TAMSULOSIN HCL 0.4 MG PO CAPS: 0.4000 mg | ORAL_CAPSULE | Freq: Every day | ORAL | 0 refills | Status: AC

## 2021-11-01 NOTE — ED Provider Notes (Signed)
Last [facility] EMERGENCY DEPARTMENT Provider Note   CSN: [id number] Arrival date & time: 11/01/21  1732     History  Chief Complaint  Patient presents with   Dysuria    Mason Stark is a 81 y.o. male.  This is a 81 y.o. male with significant medical history as below, including BPH, CKD, hypertension, hyperlipidemia who presents to the ED with complaint of difficulty with urination  Patient reports yesterday evening he was having difficulty going the bathroom.  Attempted use of bathroom 3-4 times without being able to produce urine.  Tolerate oral intake without difficulty.  No nausea or vomiting, no abdominal pain.  Approximately 4 PM today he was able to urinate spontaneously.  He has had 2 complete voids since then. No suprapubic pain, no rectal pain, no scrotal or penile discomfort. He continues to tolerate PO w/o problem. 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               Hiatal hernia. Incidental gastric polyp status               polypectomy x3 No date: HERNIA REPAIR 1986: KIDNEY STONE SURGERY     Comment:  complications, ureteral injury, multiple surgeries were               required No date: POLYPECTOMY No date: UPPER GASTROINTESTINAL ENDOSCOPY    The history is provided by the patient  and the spouse. No language interpreter was used.  Dysuria Presenting symptoms: no dysuria, no penile discharge and no scrotal pain   Associated symptoms: no abdominal pain, no fever, no hematuria, no nausea and no vomiting       Home Medications Prior to Admission medications   Medication Sig Start Date End Date Taking? Authorizing Provider  tamsulosin (FLOMAX) 0.4 MG CAPS capsule Take 1 capsule (0.4 mg total) by mouth daily after breakfast for 7 days. 11/01/21 11/08/21 Yes Wynona Dove A, DO  amLODipine (NORVASC) 10 MG tablet Take 10 mg by mouth daily.    [provider]  aspirin EC 81 MG tablet Take 81 mg by mouth daily.    [provider]  doxycycline (VIBRAMYCIN) 100 MG capsule Take 100 mg by mouth every 12 (twelve) hours. 10 day course started 12/19/17 am    [provider]  dutasteride (AVODART) 0.5 MG capsule Take 0.5 mg by mouth daily.    [provider]  lisinopril-hydrochlorothiazide (PRINZIDE,ZESTORETIC) 20-12.5 MG tablet Take 1 tablet by mouth daily.    [provider]  omeprazole (PRILOSEC) 20 MG capsule Take 20 mg by mouth daily. Patient not taking: Reported on 04/21/2020    [provider]  omeprazole (PRILOSEC) 40 MG capsule  03/25/20   [provider]  Vitamin A 2400 MCG (8000 UT) CAPS Take by mouth.    [provider]  Vitamin D, Ergocalciferol, (DRISDOL) 1.25 MG (50000 UNIT) CAPS capsule  12/07/19   [provider]      Allergies    Clindamycin/lincomycin, Doxycycline, Nitroglycerin, and Trazodone and nefazodone    Review of Systems   Review of Systems  Constitutional:  Negative for chills and fever.  HENT:  Negative for facial swelling and trouble swallowing.   Eyes:  Negative for photophobia and visual disturbance.  Respiratory:  Negative for cough and shortness of breath.   Cardiovascular:  Negative for chest pain and palpitations.  Gastrointestinal:  Negative for abdominal pain, nausea and  vomiting.  Endocrine: Negative for polydipsia and polyuria.  Genitourinary:  Positive for difficulty urinating. Negative for dysuria, hematuria and penile discharge.  Musculoskeletal:  Negative for gait problem and joint swelling.  Skin:  Negative for pallor and rash.  Neurological:  Negative for syncope and headaches.  Psychiatric/Behavioral:  Negative for agitation and confusion.    Physical Exam Updated Vital Signs BP (!) 163/95 (BP Location: Left Arm)    Pulse 96    Temp 98.3 F (36.8 C) (Oral)    Resp 18    Ht 5\' 8"  (1.727 m)    Wt 68.5 kg    SpO2 100%    BMI 22.96 kg/m  Physical Exam Vitals and nursing note reviewed.  Constitutional:      General: He is not in acute distress.    Appearance: He is well-developed.  HENT:     Head: Normocephalic and atraumatic.     Right Ear: External ear normal.     Left Ear: External ear normal.     Mouth/Throat:     Mouth: Mucous membranes are moist.  Eyes:     General: No scleral icterus. Cardiovascular:     Rate and Rhythm: Normal rate and regular rhythm.     Pulses: Normal pulses.     Heart sounds: Normal heart sounds.  Pulmonary:     Effort: Pulmonary effort is normal. No respiratory distress.     Breath sounds: Normal breath sounds.  Abdominal:     General: Abdomen is flat.     Palpations: Abdomen is soft.     Tenderness: There is no abdominal tenderness.     Comments: Nontender abdomen   Musculoskeletal:        General: Normal range of motion.     Cervical back: Normal range of motion.     Right lower leg: No edema.     Left lower leg: No edema.  Skin:    General: Skin is warm and dry.     Capillary Refill: Capillary refill takes less than 2 seconds.  Neurological:     Mental Status: He is alert and oriented to person, place, and time.  Psychiatric:        Mood and Affect: Mood normal.        Behavior: Behavior normal.    ED Results / Procedures / Treatments   Labs (all labs ordered are listed, but only abnormal  results are displayed) Labs Reviewed  URINALYSIS, ROUTINE W REFLEX MICROSCOPIC    EKG None  Radiology No results found.  Procedures Procedures    Medications Ordered in ED Medications - No data to display  ED Course/ Medical Decision Making/ A&P                           Medical Decision Making Amount and/or Complexity of Data Reviewed Labs: ordered.  Risk Prescription drug  management.    CC: diff urinating   This patient presents to the Emergency Department for the above complaint. This involves an extensive number of treatment options and is a complaint that carries with it a high risk of complications and morbidity. Vital signs were reviewed. Serious etiologies considered.  Record review:  Previous records obtained and reviewed   Additional history obtained from spouse  Medical and surgical history as noted above.   Work up as above, notable for:   lab results that were available during my care of the patient were visualized by me and considered in my medical decision making.   Urinalysis without evidence of infection or blood.   Low suspicion for UTI, kidney stone although this was considered   Social determinants of health include - N/a  Pt able to void spontaneously while in the ED, no abd pain on exam, HDS. Feels back to baseline.  Will start on flomax, have pt f/u with his urologist tomorrow.  The patient improved significantly and was discharged in stable condition. Detailed discussions were had with the patient regarding current findings, and need for close f/u with PCP or on call doctor. The patient has been instructed to return immediately if the symptoms worsen in any way for re-evaluation. Patient verbalized understanding and is in agreement with current care plan. All questions answered prior to discharge.      This chart was dictated using voice recognition software.  Despite best efforts to proofread,  errors can occur which can change the  documentation meaning.         Final Clinical Impression(s) / ED Diagnoses Final diagnoses:  Difficulty urinating    Rx / DC Orders ED Discharge Orders          Ordered    tamsulosin (FLOMAX) 0.4 MG CAPS capsule  Daily after breakfast        11/01/21 1849              Jeanell Sparrow, DO 11/01/21 1854

## 2021-11-01 NOTE — Discharge Instructions (Signed)
It was a pleasure caring for you today in the emergency department. ? ?Please follow-up with your urologist ? ?Please return to the emergency department for any worsening or worrisome symptoms. ? ?

## 2021-11-01 NOTE — ED Triage Notes (Signed)
Pt state difficulty urinating last night but urinating well throughout the day ?Had normal void prior to arrival ?Hx of prostate issues ? ?

## 2021-11-23 ENCOUNTER — Encounter (HOSPITAL_COMMUNITY): Payer: Self-pay

## 2021-11-23 ENCOUNTER — Other Ambulatory Visit: Payer: Self-pay

## 2021-11-23 ENCOUNTER — Emergency Department (HOSPITAL_COMMUNITY)
Admission: EM | Admit: 2021-11-23 | Discharge: 2021-11-23 | Disposition: A | Discharge status: Home / Self Care | Payer: Medicare PPO | Attending: Emergency Medicine | Admitting: Emergency Medicine

## 2021-11-23 DIAGNOSIS — Z7982 Long term (current) use of aspirin: Secondary | ICD-10-CM | POA: Insufficient documentation

## 2021-11-23 DIAGNOSIS — R109 Unspecified abdominal pain: Secondary | ICD-10-CM | POA: Insufficient documentation

## 2021-11-23 DIAGNOSIS — R339 Retention of urine, unspecified: Secondary | ICD-10-CM | POA: Diagnosis present

## 2021-11-23 DIAGNOSIS — I10 Essential (primary) hypertension: Secondary | ICD-10-CM | POA: Insufficient documentation

## 2021-11-23 DIAGNOSIS — Z79899 Other long term (current) drug therapy: Secondary | ICD-10-CM | POA: Diagnosis not present

## 2021-11-23 LAB — URINALYSIS, ROUTINE W REFLEX MICROSCOPIC
Bilirubin Urine: NEGATIVE
Glucose, UA: NEGATIVE mg/dL
Hgb urine dipstick: NEGATIVE
Ketones, ur: NEGATIVE mg/dL
Leukocytes,Ua: NEGATIVE
Nitrite: NEGATIVE
Protein, ur: NEGATIVE mg/dL
Specific Gravity, Urine: 1.005 (ref 1.005–1.030)
pH: 7 (ref 5.0–8.0)

## 2021-11-23 LAB — CBC WITH DIFFERENTIAL/PLATELET
Abs Immature Granulocytes: 0.01 10*3/uL (ref 0.00–0.07)
Basophils Absolute: 0 10*3/uL (ref 0.0–0.1)
Basophils Relative: 1 %
Eosinophils Absolute: 0.1 10*3/uL (ref 0.0–0.5)
Eosinophils Relative: 1 %
HCT: 37.4 % — ABNORMAL LOW (ref 39.0–52.0)
Hemoglobin: 13.4 g/dL (ref 13.0–17.0)
Immature Granulocytes: 0 %
Lymphocytes Relative: 18 %
Lymphs Abs: 1.2 10*3/uL (ref 0.7–4.0)
MCH: 32.8 pg (ref 26.0–34.0)
MCHC: 35.8 g/dL (ref 30.0–36.0)
MCV: 91.4 fL (ref 80.0–100.0)
Monocytes Absolute: 0.6 10*3/uL (ref 0.1–1.0)
Monocytes Relative: 10 %
Neutro Abs: 4.6 10*3/uL (ref 1.7–7.7)
Neutrophils Relative %: 70 %
Platelets: 220 10*3/uL (ref 150–400)
RBC: 4.09 MIL/uL — ABNORMAL LOW (ref 4.22–5.81)
RDW: 11.7 % (ref 11.5–15.5)
WBC: 6.5 10*3/uL (ref 4.0–10.5)
nRBC: 0 % (ref 0.0–0.2)

## 2021-11-23 LAB — BASIC METABOLIC PANEL
Anion gap: 6 (ref 5–15)
BUN: 14 mg/dL (ref 8–23)
CO2: 26 mmol/L (ref 22–32)
Calcium: 9 mg/dL (ref 8.9–10.3)
Chloride: 101 mmol/L (ref 98–111)
Creatinine, Ser: 0.65 mg/dL (ref 0.61–1.24)
GFR, Estimated: >60 mL/min (ref >60)
Glucose, Bld: 112 mg/dL — ABNORMAL HIGH (ref 70–99)
Potassium: 3.8 mmol/L (ref 3.5–5.1)
Sodium: 133 mmol/L — ABNORMAL LOW (ref 135–145)

## 2021-11-23 NOTE — ED Provider Notes (Signed)
?[facility] DEPT ?Provider Note ? ? ?CSN: [id number] ?Arrival date & time: 11/23/21  0620 ? ?  ? ?History ? ?Chief Complaint  ?Patient presents with  ? Urinary Retention  ? ? ?Mason Stark is a 81 y.o. male. ? ?Patient states that he had difficulty urinating today.  He is on Flomax.  He has been able to pee but its been difficult.  He had a ultrasound done that showed about 200 cc in his bladder.  Patient has history hypertension ? ?The history is provided by the patient and medical records. No language interpreter was used.  ?Abdominal Cramping ?This is a new problem. The current episode started 6 to 12 hours ago. The problem occurs rarely. The problem has not changed since onset.Pertinent negatives include no chest pain, no abdominal pain and no headaches. Nothing aggravates the symptoms. Nothing relieves the symptoms. He has tried nothing for the symptoms. The treatment provided no relief.  ? ?  ? ?Home Medications ?Prior to Admission medications   ?Medication Sig Start Date End Date Taking? Authorizing Provider  ?amLODipine (NORVASC) 10 MG tablet Take 10 mg by mouth daily.    [provider]  ?aspirin EC 81 MG tablet Take 81 mg by mouth daily.    [provider]  ?doxycycline (VIBRAMYCIN) 100 MG capsule Take 100 mg by mouth every 12 (twelve) hours. 10 day course started 12/19/17 am    [provider]  ?dutasteride (AVODART) 0.5 MG capsule Take 0.5 mg by mouth daily.    [provider]  ?lisinopril-hydrochlorothiazide (PRINZIDE,ZESTORETIC) 20-12.5 MG tablet Take 1 tablet by mouth daily.    [provider]  ?omeprazole (PRILOSEC) 20 MG capsule Take 20 mg by mouth daily. ?Patient not taking: Reported on 04/21/2020    [provider]  ?omeprazole (PRILOSEC) 40 MG capsule  03/25/20   [provider]  ?Vitamin A 2400 MCG (8000 UT) CAPS Take by mouth.    [provider]  ?Vitamin D, Ergocalciferol, (DRISDOL) 1.25 MG (50000  UNIT) CAPS capsule  12/07/19   [provider]  ?   ? ?Allergies    ?Clindamycin/lincomycin, Doxycycline, Nitroglycerin, and Trazodone and nefazodone   ? ?Review of Systems   ?Review of Systems  ?Constitutional:  Negative for appetite change and fatigue.  ?HENT:  Negative for congestion, ear discharge and sinus pressure.   ?Eyes:  Negative for discharge.  ?Respiratory:  Negative for cough.   ?Cardiovascular:  Negative for chest pain.  ?Gastrointestinal:  Negative for abdominal pain and diarrhea.  ?Genitourinary:  Positive for difficulty urinating. Negative for frequency and hematuria.  ?Musculoskeletal:  Negative for back pain.  ?Skin:  Negative for rash.  ?Neurological:  Negative for seizures and headaches.  ?Psychiatric/Behavioral:  Negative for hallucinations.   ? ?Physical Exam ?Updated Vital Signs ?BP (!) 164/74   Pulse 100   Temp 98.4 ?F (36.9 ?C) (Oral)   Resp 18   Ht '5\' 8"'$  (1.727 m)   Wt 68.5 kg   SpO2 98%   BMI 22.96 kg/m?  ?Physical Exam ?Vitals and nursing note reviewed.  ?Constitutional:   ?   Appearance: He is well-developed.  ?HENT:  ?   Head: Normocephalic.  ?Eyes:  ?   General: No scleral icterus. ?   Conjunctiva/sclera: Conjunctivae normal.  ?Neck:  ?   Thyroid: No thyromegaly.  ?Cardiovascular:  ?   Rate and Rhythm: Normal rate and regular rhythm.  ?   Heart sounds: No murmur heard. ?  No friction  rub. No gallop.  ?Pulmonary:  ?   Breath sounds: No stridor. No wheezing or rales.  ?Chest:  ?   Chest wall: No tenderness.  ?Abdominal:  ?   General: There is no distension.  ?   Tenderness: There is abdominal tenderness. There is no rebound.  ?Musculoskeletal:     ?   General: Normal range of motion.  ?   Cervical back: Neck supple.  ?Lymphadenopathy:  ?   Cervical: No cervical adenopathy.  ?Skin: ?   Findings: No erythema or rash.  ?Neurological:  ?   Mental Status: He is alert and oriented to person, place, and time.  ?   Motor: No abnormal muscle tone.  ?   Coordination: Coordination  normal.  ?Psychiatric:     ?   Behavior: Behavior normal.  ? ? ?ED Results / Procedures / Treatments   ?Labs ?(all labs ordered are listed, but only abnormal results are displayed) ?Labs Reviewed  ?URINALYSIS, ROUTINE W REFLEX MICROSCOPIC - Abnormal; Notable for the following components:  ?    Result Value  ? Color, Urine STRAW (*)   ? All other components within normal limits  ?CBC WITH DIFFERENTIAL/PLATELET - Abnormal; Notable for the following components:  ? RBC 4.09 (*)   ? HCT 37.4 (*)   ? All other components within normal limits  ?BASIC METABOLIC PANEL - Abnormal; Notable for the following components:  ? Sodium 133 (*)   ? Glucose, Bld 112 (*)   ? All other components within normal limits  ? ? ?EKG ?None ? ?Radiology ?No results found. ? ?Procedures ?Procedures  ? ? ?Medications Ordered in ED ?Medications - No data to display ? ?ED Course/ Medical Decision Making/ A&P ?  ?                        ?Medical Decision Making ?Amount and/or Complexity of Data Reviewed ?Labs: ordered. ? ?This patient presents to the ED for concern of difficulty urinating, this involves an extensive number of treatment options, and is a complaint that carries with it a high risk of complications and morbidity.  The differential diagnosis includes UTI, enlarged prostate ? ? ?Co morbidities that complicate the patient evaluation ? ?Hypertension ? ? ?Additional history obtained: ? ?Additional history obtained from patient ?External records from outside source obtained and reviewed including hospital records ? ? ?Lab Tests: ? ?I Ordered, and personally interpreted labs.  The pertinent results include: CBC, chemistries, urinalysis all unremarkable ? ? ?Imaging Studies ordered: ? ?I ordered imaging studies including bilateral ultrasound ?I independently visualized and interpreted imaging which showed showed 200 cc ?I agree with the radiologist interpretation ? ? ?Cardiac Monitoring: / EKG: ? ?The patient was maintained on a cardiac  monitor.  I personally viewed and interpreted the cardiac monitored which showed an underlying rhythm of: Normal sinus rhythm ? ? ?Consultations Obtained: ? ?No consultant ? ?Problem List / ED Course / Critical interventions / Medication management ? ?Urinary retention, hypertension ?I ordered medication including none ?Reevaluation of the patient after these medicines showed that the patient stayed the same ?I have reviewed the patients home medicines and have made adjustments as needed ? ? ?Social Determinants of Health: ? ?None ? ? ?Test / Admission - Considered: ? ?CT abdomen ? ?Patient with urinary retention but still able to urinate.  Labs unremarkable.  He will follow back up with the urologist next week and will continue taking his Flomax.  He will  return if problems ? ? ? ? ? ? ? ?Final Clinical Impression(s) / ED Diagnoses ?Final diagnoses:  ?Urinary retention  ? ? ?Rx / DC Orders ?ED Discharge Orders   ? ? None  ? ?  ? ? ?  ?Milton Ferguson, MD ?11/27/21 1008 ? ?

## 2021-11-23 NOTE — ED Triage Notes (Signed)
Pt presents to ED from home, states he was placed on Flomax last week which helped him urinate regularly but states over the last 3-4 days he has been having difficulty urinating and urinating small amounts. Endorses right and left flank and back pain.  ?

## 2021-11-23 NOTE — ED Notes (Signed)
Bladder scan 240 ml after urinating ?

## 2021-11-23 NOTE — Discharge Instructions (Signed)
Continue using your Flomax.  If you get to the point where he cannot urinate then just return.  Keep your appointment next week.  Contact your urologist if any problem ?

## 2021-11-25 ENCOUNTER — Other Ambulatory Visit: Payer: Self-pay

## 2021-11-25 ENCOUNTER — Emergency Department (HOSPITAL_COMMUNITY): Payer: Medicare PPO

## 2021-11-25 ENCOUNTER — Encounter (HOSPITAL_COMMUNITY): Payer: Self-pay | Admitting: Emergency Medicine

## 2021-11-25 ENCOUNTER — Emergency Department (HOSPITAL_COMMUNITY)
Admission: EM | Admit: 2021-11-25 | Discharge: 2021-11-25 | Disposition: A | Discharge status: Home / Self Care | Payer: Medicare PPO | Attending: Emergency Medicine | Admitting: Emergency Medicine

## 2021-11-25 DIAGNOSIS — Z8546 Personal history of malignant neoplasm of prostate: Secondary | ICD-10-CM | POA: Diagnosis not present

## 2021-11-25 DIAGNOSIS — I10 Essential (primary) hypertension: Secondary | ICD-10-CM | POA: Diagnosis not present

## 2021-11-25 DIAGNOSIS — R39198 Other difficulties with micturition: Secondary | ICD-10-CM | POA: Diagnosis not present

## 2021-11-25 DIAGNOSIS — Z7982 Long term (current) use of aspirin: Secondary | ICD-10-CM | POA: Insufficient documentation

## 2021-11-25 DIAGNOSIS — Z79899 Other long term (current) drug therapy: Secondary | ICD-10-CM | POA: Diagnosis not present

## 2021-11-25 DIAGNOSIS — R350 Frequency of micturition: Secondary | ICD-10-CM | POA: Diagnosis present

## 2021-11-25 LAB — URINALYSIS, ROUTINE W REFLEX MICROSCOPIC
Bilirubin Urine: NEGATIVE
Glucose, UA: NEGATIVE mg/dL
Hgb urine dipstick: NEGATIVE
Ketones, ur: NEGATIVE mg/dL
Leukocytes,Ua: NEGATIVE
Nitrite: NEGATIVE
Protein, ur: NEGATIVE mg/dL
Specific Gravity, Urine: <1.005 — ABNORMAL LOW (ref 1.005–1.030)
pH: 7 (ref 5.0–8.0)

## 2021-11-25 NOTE — ED Provider Notes (Signed)
?[facility] DEPT ?Provider Note ? ? ?CSN: [id number] ?Arrival date & time: 11/25/21  1305 ? ?  ? ?History ? ?Chief Complaint  ?Patient presents with  ? Urinary Frequency  ? ? ?Mason Stark is a 81 y.o. male. ? ?81 year old male presents today for evaluation of difficulty urinating that has been ongoing for about a month.  This is patient's third visit to the emergency room in this timeframe.  Patient was initially seen at beginning of March and was prescribed Flomax.  He states after starting Flomax he has had about 2-3 good weeks without much complaints.  He states over the past week he has become symptomatic again.  He endorses incomplete urinary stream, urinary frequency, and nocturia.  He was evaluated by urologist after his initial visit to the emergency room and he has a return visit planned for upcoming Friday.  He does have history of BPH.  They mention potential procedure.  He denies fever, dysuria, abdominal pain.  He endorses occasional discomfort in his left flank. ? ?The history is provided by the patient. No language interpreter was used.  ? ?  ? ?Home Medications ?Prior to Admission medications   ?Medication Sig Start Date End Date Taking? Authorizing Provider  ?amLODipine (NORVASC) 10 MG tablet Take 10 mg by mouth daily.    [provider]  ?aspirin EC 81 MG tablet Take 81 mg by mouth daily.    [provider]  ?doxycycline (VIBRAMYCIN) 100 MG capsule Take 100 mg by mouth every 12 (twelve) hours. 10 day course started 12/19/17 am    [provider]  ?dutasteride (AVODART) 0.5 MG capsule Take 0.5 mg by mouth daily.    [provider]  ?lisinopril-hydrochlorothiazide (PRINZIDE,ZESTORETIC) 20-12.5 MG tablet Take 1 tablet by mouth daily.    [provider]  ?omeprazole (PRILOSEC) 20 MG capsule Take 20 mg by mouth daily. ?Patient not taking: Reported on 04/21/2020    [provider]  ?omeprazole (PRILOSEC) 40 MG capsule   03/25/20   [provider]  ?Vitamin A 2400 MCG (8000 UT) CAPS Take by mouth.    [provider]  ?Vitamin D, Ergocalciferol, (DRISDOL) 1.25 MG (50000 UNIT) CAPS capsule  12/07/19   [provider]  ?   ? ?Allergies    ?Clindamycin/lincomycin, Doxycycline, Nitroglycerin, and Trazodone and nefazodone   ? ?Review of Systems   ?Review of Systems  ?Constitutional:  Negative for activity change, chills and fever.  ?Gastrointestinal:  Negative for abdominal pain, nausea and vomiting.  ?Genitourinary:  Positive for difficulty urinating, flank pain (Intermittently) and frequency. Negative for dysuria and hematuria.  ?All other systems reviewed and are negative. ? ?Physical Exam ?Updated Vital Signs ?BP (!) 159/79 (BP Location: Right Arm)   Pulse 86   Temp 98.4 ?F (36.9 ?C) (Oral)   Resp 18   SpO2 100%  ?Physical Exam ?Vitals and nursing note reviewed.  ?Constitutional:   ?   General: He is not in acute distress. ?   Appearance: Normal appearance. He is not ill-appearing.  ?HENT:  ?   Head: Normocephalic and atraumatic.  ?   Nose: Nose normal.  ?Eyes:  ?   General: No scleral icterus. ?   Extraocular Movements: Extraocular movements intact.  ?   Conjunctiva/sclera: Conjunctivae normal.  ?Cardiovascular:  ?   Rate and Rhythm: Normal rate and regular rhythm.  ?   Pulses: Normal pulses.  ?Pulmonary:  ?   Effort: Pulmonary effort is normal. No respiratory distress.  ?  Breath sounds: Normal breath sounds. No wheezing or rales.  ?Abdominal:  ?   General: There is no distension.  ?   Palpations: Abdomen is soft.  ?   Tenderness: There is no abdominal tenderness. There is no right CVA tenderness, left CVA tenderness or guarding.  ?Musculoskeletal:     ?   General: Normal range of motion.  ?   Cervical back: Normal range of motion.  ?Skin: ?   General: Skin is warm and dry.  ?Neurological:  ?   General: No focal deficit present.  ?   Mental Status: He is alert. Mental status is at baseline.  ? ? ?ED  Results / Procedures / Treatments   ?Labs ?(all labs ordered are listed, but only abnormal results are displayed) ?Labs Reviewed  ?URINALYSIS, ROUTINE W REFLEX MICROSCOPIC - Abnormal; Notable for the following components:  ?    Result Value  ? Specific Gravity, Urine <1.005 (*)   ? All other components within normal limits  ? ? ?EKG ?None ? ?Radiology ?CT Renal Stone Study ? ?Result Date: 11/25/2021 ?CLINICAL DATA:  Prostate issues, frequency, incomplete emptying, weak stream. Right flank pain EXAM: CT ABDOMEN AND PELVIS WITHOUT CONTRAST TECHNIQUE: Multidetector CT imaging of the abdomen and pelvis was performed following the standard protocol without IV contrast. RADIATION DOSE REDUCTION: This exam was performed according to the departmental dose-optimization program which includes automated exposure control, adjustment of the mA and/or kV according to patient size and/or use of iterative reconstruction technique. COMPARISON:  CT abdomen/pelvis 03/01/2018 FINDINGS: Lower chest: The lung bases are clear. The imaged heart is unremarkable. Hepatobiliary: The liver is unremarkable, within the confines of noncontrast technique. The gallbladder is not identified, presumed surgically absent. There is no biliary ductal dilatation. Pancreas: Unremarkable, within the confines of noncontrast technique. Spleen: There are a few calcified granulomas in the spleen, unchanged. Adrenals/Urinary Tract: The adrenals are unremarkable. There is a subcentimeter focus of hyperdensity in the left lower pole most likely reflecting small hemorrhagic/proteinaceous cyst. There are no other focal parenchymal lesions, within the confines of noncontrast technique. No stones are seen. There is mild left hydroureteronephrosis with no obstructing lesion or stone seen. 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 This is similar to the ultrasound from 11/03/2021, allowing  for differences in modality, and not significantly changed compared to the CT from 03/01/2018. There is no right hydroureteronephrosis. Bladder is distended with mass effect from the enlarged prostate. Stomach/Bowel: Stomach is grossly unremarkable. There is no evidence of bowel obstruction. There is a moderate colonic stool burden. There is diverticulosis without evidence of acute diverticulitis. There is no abnormal bowel wall thickening or inflammatory change. A portion of the appendix is identified and is normal. There is no pericecal inflammatory change. Vascular/Lymphatic: There is extensive calcified atherosclerotic plaque throughout the nonaneurysmal abdominal aorta. There is no abdominopelvic lymphadenopathy. Reproductive: The prostate is enlarged measuring up to 5.3 cm transverse with mass effect on the inferior aspect of the bladder. Other: There is no ascites or free air. 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 Musculoskeletal: There is no acute osseous abnormality or aggressive osseous lesion. IMPRESSION: 1. Mild left hydroureteronephrosis is similar to the prior CT from 03/01/2018, with possible prior surgical reimplantation of the ureter into the superolateral aspect of the bladder. No obstructing stone or lesion is seen. There is no  right hydroureteronephrosis. 2. Distended bladder with mass effect along the inferior aspect by the enlarged prostate. Given the reported history of recent voiding, this likely reflects outlet obstruction. 3. Moderate colonic stool burden. Diverticulosis without evidence of acute diverticulitis. 4.  Aortic Atherosclerosis (ICD10-I70.0). Electronically Signed   By: Valetta Mole M.D.   On: 11/25/2021 14:47   ? ?Procedures ?Procedures  ? ? ?Medications Ordered in ED ?Medications - No data to display ? ?ED Course/ Medical Decision Making/ A&P ?  ?                        ?Medical  Decision Making ?Amount and/or Complexity of Data Reviewed ?Labs: ordered. ? ? ?Medical Decision Making / ED Course ? ? ?This patient presents to the ED for concern of difficulty urinating, flank pain, this involve

## 2021-11-25 NOTE — ED Provider Triage Note (Signed)
Emergency Medicine Provider Triage Evaluation Note ? ?Venita Sheffield , a 81 y.o. male  was evaluated in triage.  Pt complains of frequent urination and discomfort ? ?Review of Systems  ?Positive: Difficulty urinating ?Negative: fever ? ?Physical Exam  ?BP (!) 159/79 (BP Location: Right Arm)   Pulse 86   Temp 98.4 ?F (36.9 ?C) (Oral)   Resp 18   SpO2 100%  ?Gen:   Awake, no distress   ?Resp:  Normal effort  ?MSK:   Moves extremities without difficulty  ?Other:   ? ?Medical Decision Making  ?Medically screening exam initiated at 1:40 PM.  Appropriate orders placed.  Micah Dilger was informed that the remainder of the evaluation will be completed by another provider, this initial triage assessment does not replace that evaluation, and the importance of remaining in the ED until their evaluation is complete. ? ? ?  ?Fransico Meadow, [place] ?11/25/21 1341 ? ?

## 2021-11-25 NOTE — ED Notes (Signed)
Bladder scan showed 130, 137 and 169 mls. ?

## 2021-11-25 NOTE — Discharge Instructions (Addendum)
Your work-up today was reassuring.  Your urine did not show any signs of infection.  Your CT scan did not show any concerning findings within the abdomen.  It was negative for kidney stone.  It did show that your bladder was distended which can be consistent with BPH.  If you are unable to pee for several hours and they have increasing pressure in the bladder you can return and we can discuss placing a Foley catheter to relieve your symptoms until you follow-up with urology.  You have an appointment upcoming with urology on Friday.  Recommend you call urology Monday morning to notify them of your current symptoms and see if you can be seen sooner.  Continue taking Flomax.  If you are unable to void, or have worsening symptoms please return to the emergency room for evaluation. ?

## 2021-11-25 NOTE — ED Triage Notes (Signed)
Patient reports ongoing issues w/ his prostate, states he has frequency, feeling of incomplete emptying and weak urine stream. R flank pain, last urinated 15 min ago.  ?

## 2022-03-16 ENCOUNTER — Other Ambulatory Visit: Payer: Self-pay | Admitting: Urology

## 2022-04-05 ENCOUNTER — Encounter (HOSPITAL_COMMUNITY): Payer: Self-pay

## 2022-04-05 ENCOUNTER — Other Ambulatory Visit: Payer: Self-pay

## 2022-04-05 ENCOUNTER — Observation Stay (HOSPITAL_COMMUNITY)
Admission: AD | Admit: 2022-04-05 | Discharge: 2022-04-07 | Disposition: A | Discharge status: Home / Self Care | Payer: Medicare PPO | Source: Other Acute Inpatient Hospital | Attending: Internal Medicine | Admitting: Internal Medicine

## 2022-04-05 DIAGNOSIS — K851 Biliary acute pancreatitis without necrosis or infection: Secondary | ICD-10-CM

## 2022-04-05 DIAGNOSIS — R5381 Other malaise: Secondary | ICD-10-CM | POA: Diagnosis not present

## 2022-04-05 DIAGNOSIS — R911 Solitary pulmonary nodule: Secondary | ICD-10-CM | POA: Diagnosis not present

## 2022-04-05 DIAGNOSIS — F419 Anxiety disorder, unspecified: Secondary | ICD-10-CM | POA: Diagnosis present

## 2022-04-05 DIAGNOSIS — R1319 Other dysphagia: Secondary | ICD-10-CM

## 2022-04-05 DIAGNOSIS — K449 Diaphragmatic hernia without obstruction or gangrene: Principal | ICD-10-CM | POA: Insufficient documentation

## 2022-04-05 DIAGNOSIS — K8020 Calculus of gallbladder without cholecystitis without obstruction: Secondary | ICD-10-CM | POA: Insufficient documentation

## 2022-04-05 DIAGNOSIS — Z79899 Other long term (current) drug therapy: Secondary | ICD-10-CM | POA: Diagnosis not present

## 2022-04-05 DIAGNOSIS — K838 Other specified diseases of biliary tract: Secondary | ICD-10-CM

## 2022-04-05 DIAGNOSIS — Z9049 Acquired absence of other specified parts of digestive tract: Secondary | ICD-10-CM | POA: Diagnosis not present

## 2022-04-05 DIAGNOSIS — R1310 Dysphagia, unspecified: Secondary | ICD-10-CM | POA: Diagnosis not present

## 2022-04-05 DIAGNOSIS — Z800 Family history of malignant neoplasm of digestive organs: Secondary | ICD-10-CM

## 2022-04-05 DIAGNOSIS — N319 Neuromuscular dysfunction of bladder, unspecified: Secondary | ICD-10-CM | POA: Diagnosis present

## 2022-04-05 DIAGNOSIS — R7989 Other specified abnormal findings of blood chemistry: Secondary | ICD-10-CM | POA: Diagnosis not present

## 2022-04-05 DIAGNOSIS — Z7982 Long term (current) use of aspirin: Secondary | ICD-10-CM | POA: Insufficient documentation

## 2022-04-05 DIAGNOSIS — N189 Chronic kidney disease, unspecified: Secondary | ICD-10-CM | POA: Diagnosis not present

## 2022-04-05 DIAGNOSIS — K2980 Duodenitis without bleeding: Secondary | ICD-10-CM | POA: Insufficient documentation

## 2022-04-05 DIAGNOSIS — Z87891 Personal history of nicotine dependence: Secondary | ICD-10-CM | POA: Diagnosis not present

## 2022-04-05 DIAGNOSIS — R339 Retention of urine, unspecified: Secondary | ICD-10-CM | POA: Diagnosis not present

## 2022-04-05 DIAGNOSIS — K5900 Constipation, unspecified: Secondary | ICD-10-CM | POA: Diagnosis not present

## 2022-04-05 DIAGNOSIS — R932 Abnormal findings on diagnostic imaging of liver and biliary tract: Secondary | ICD-10-CM

## 2022-04-05 DIAGNOSIS — Z8249 Family history of ischemic heart disease and other diseases of the circulatory system: Secondary | ICD-10-CM | POA: Diagnosis not present

## 2022-04-05 DIAGNOSIS — K222 Esophageal obstruction: Secondary | ICD-10-CM | POA: Insufficient documentation

## 2022-04-05 DIAGNOSIS — K2100 Gastro-esophageal reflux disease with esophagitis, without bleeding: Secondary | ICD-10-CM | POA: Diagnosis present

## 2022-04-05 DIAGNOSIS — I10 Essential (primary) hypertension: Secondary | ICD-10-CM

## 2022-04-05 DIAGNOSIS — K219 Gastro-esophageal reflux disease without esophagitis: Secondary | ICD-10-CM | POA: Diagnosis present

## 2022-04-05 DIAGNOSIS — E871 Hypo-osmolality and hyponatremia: Secondary | ICD-10-CM | POA: Diagnosis not present

## 2022-04-05 DIAGNOSIS — E860 Dehydration: Secondary | ICD-10-CM | POA: Diagnosis present

## 2022-04-05 DIAGNOSIS — F32A Depression, unspecified: Secondary | ICD-10-CM | POA: Diagnosis present

## 2022-04-05 DIAGNOSIS — I129 Hypertensive chronic kidney disease with stage 1 through stage 4 chronic kidney disease, or unspecified chronic kidney disease: Secondary | ICD-10-CM | POA: Diagnosis not present

## 2022-04-05 DIAGNOSIS — K8500 Idiopathic acute pancreatitis without necrosis or infection: Secondary | ICD-10-CM

## 2022-04-05 DIAGNOSIS — K8050 Calculus of bile duct without cholangitis or cholecystitis without obstruction: Secondary | ICD-10-CM | POA: Diagnosis not present

## 2022-04-05 DIAGNOSIS — K317 Polyp of stomach and duodenum: Secondary | ICD-10-CM | POA: Insufficient documentation

## 2022-04-05 DIAGNOSIS — E785 Hyperlipidemia, unspecified: Secondary | ICD-10-CM | POA: Diagnosis present

## 2022-04-05 DIAGNOSIS — R338 Other retention of urine: Secondary | ICD-10-CM | POA: Diagnosis not present

## 2022-04-05 DIAGNOSIS — N401 Enlarged prostate with lower urinary tract symptoms: Secondary | ICD-10-CM | POA: Diagnosis present

## 2022-04-05 LAB — HEPATIC FUNCTION PANEL
ALT: 44 U/L (ref 0–44)
AST: 48 U/L — ABNORMAL HIGH (ref 15–41)
Albumin: 3.3 g/dL — ABNORMAL LOW (ref 3.5–5.0)
Alkaline Phosphatase: 49 U/L (ref 38–126)
Bilirubin, Direct: 0.2 mg/dL (ref 0.0–0.2)
Indirect Bilirubin: 1.1 mg/dL — ABNORMAL HIGH (ref 0.3–0.9)
Total Bilirubin: 1.3 mg/dL — ABNORMAL HIGH (ref 0.3–1.2)
Total Protein: 5.6 g/dL — ABNORMAL LOW (ref 6.5–8.1)

## 2022-04-05 LAB — CBC
HCT: 35.5 % — ABNORMAL LOW (ref 39.0–52.0)
Hemoglobin: 13.2 g/dL (ref 13.0–17.0)
MCH: 32.6 pg (ref 26.0–34.0)
MCHC: 37.2 g/dL — ABNORMAL HIGH (ref 30.0–36.0)
MCV: 87.7 fL (ref 80.0–100.0)
Platelets: 276 10*3/uL (ref 150–400)
RBC: 4.05 MIL/uL — ABNORMAL LOW (ref 4.22–5.81)
RDW: 12.3 % (ref 11.5–15.5)
WBC: 10.9 10*3/uL — ABNORMAL HIGH (ref 4.0–10.5)
nRBC: 0 % (ref 0.0–0.2)

## 2022-04-05 LAB — BASIC METABOLIC PANEL
Anion gap: 7 (ref 5–15)
BUN: 10 mg/dL (ref 8–23)
CO2: 23 mmol/L (ref 22–32)
Calcium: 8.7 mg/dL — ABNORMAL LOW (ref 8.9–10.3)
Chloride: 99 mmol/L (ref 98–111)
Creatinine, Ser: 0.82 mg/dL (ref 0.61–1.24)
GFR, Estimated: >60 mL/min (ref >60)
Glucose, Bld: 99 mg/dL (ref 70–99)
Potassium: 3.8 mmol/L (ref 3.5–5.1)
Sodium: 129 mmol/L — ABNORMAL LOW (ref 135–145)

## 2022-04-05 LAB — SODIUM, URINE, RANDOM: Sodium, Ur: 58 mmol/L

## 2022-04-05 LAB — OSMOLALITY: Osmolality: 274 mOsm/kg — ABNORMAL LOW (ref 275–295)

## 2022-04-05 LAB — PROTIME-INR
INR: 1 (ref 0.8–1.2)
Prothrombin Time: 13.3 seconds (ref 11.4–15.2)

## 2022-04-05 LAB — TSH: TSH: 1.27 u[IU]/mL (ref 0.350–4.500)

## 2022-04-05 LAB — URIC ACID: Uric Acid, Serum: 2.1 mg/dL — ABNORMAL LOW (ref 3.7–8.6)

## 2022-04-05 LAB — OSMOLALITY, URINE: Osmolality, Ur: 407 mOsm/kg (ref 300–900)

## 2022-04-05 MED ORDER — SODIUM CHLORIDE 0.9 % IV SOLN: 250.0000 mL | INTRAVENOUS | Status: DC | PRN

## 2022-04-05 MED ORDER — HEPARIN SODIUM (PORCINE) 5000 UNIT/ML IJ SOLN
5000.0000 [IU] | Freq: Three times a day (TID) | INTRAMUSCULAR | Status: DC
  Administered 2022-04-05 – 2022-04-07 (×5): 5000 [IU] via SUBCUTANEOUS
  Filled 2022-04-05 (×6): qty 1

## 2022-04-05 MED ORDER — ACETAMINOPHEN 650 MG RE SUPP: 650.0000 mg | Freq: Four times a day (QID) | RECTAL | Status: DC | PRN

## 2022-04-05 MED ORDER — SENNA 8.6 MG PO TABS
1.0000 | ORAL_TABLET | Freq: Every day | ORAL | Status: DC
  Administered 2022-04-06: 8.6 mg via ORAL
  Filled 2022-04-05 (×2): qty 1

## 2022-04-05 MED ORDER — ACETAMINOPHEN 325 MG PO TABS: 650.0000 mg | ORAL_TABLET | Freq: Four times a day (QID) | ORAL | Status: DC | PRN

## 2022-04-05 MED ORDER — PANTOPRAZOLE SODIUM 40 MG PO TBEC: 40.0000 mg | DELAYED_RELEASE_TABLET | Freq: Every day | ORAL | Status: DC

## 2022-04-05 MED ORDER — FLEET ENEMA 7-19 GM/118ML RE ENEM: 1.0000 | ENEMA | Freq: Once | RECTAL | Status: DC | PRN

## 2022-04-05 MED ORDER — ONDANSETRON HCL 4 MG/2ML IJ SOLN: 4.0000 mg | Freq: Four times a day (QID) | INTRAMUSCULAR | Status: DC | PRN

## 2022-04-05 MED ORDER — BISACODYL 10 MG RE SUPP: 10.0000 mg | Freq: Every day | RECTAL | Status: DC | PRN

## 2022-04-05 MED ORDER — PANTOPRAZOLE SODIUM 40 MG IV SOLR
40.0000 mg | INTRAVENOUS | Status: DC
  Administered 2022-04-05 – 2022-04-06 (×2): 40 mg via INTRAVENOUS
  Filled 2022-04-05 (×2): qty 10

## 2022-04-05 MED ORDER — AMLODIPINE BESYLATE 10 MG PO TABS
10.0000 mg | ORAL_TABLET | Freq: Every day | ORAL | Status: DC
Start: 2022-04-05 — End: 2022-04-07
  Administered 2022-04-05 – 2022-04-07 (×3): 10 mg via ORAL
  Filled 2022-04-05 (×3): qty 1

## 2022-04-05 MED ORDER — SODIUM CHLORIDE 0.9% FLUSH
3.0000 mL | INTRAVENOUS | Status: DC | PRN
Start: 2022-04-05 — End: 2022-04-07
  Administered 2022-04-06: 3 mL via INTRAVENOUS

## 2022-04-05 MED ORDER — POLYETHYLENE GLYCOL 3350 17 G PO PACK
17.0000 g | PACK | Freq: Two times a day (BID) | ORAL | Status: DC
Start: 2022-04-05 — End: 2022-04-07
  Administered 2022-04-05 – 2022-04-07 (×3): 17 g via ORAL
  Filled 2022-04-05 (×3): qty 1

## 2022-04-05 MED ORDER — ONDANSETRON HCL 4 MG PO TABS: 4.0000 mg | ORAL_TABLET | Freq: Four times a day (QID) | ORAL | Status: DC | PRN

## 2022-04-05 MED ORDER — DUTASTERIDE 0.5 MG PO CAPS
0.5000 mg | ORAL_CAPSULE | Freq: Every day | ORAL | Status: DC
Start: 2022-04-05 — End: 2022-04-07
  Administered 2022-04-05 – 2022-04-07 (×3): 0.5 mg via ORAL
  Filled 2022-04-05 (×3): qty 1

## 2022-04-05 MED ORDER — SODIUM CHLORIDE 0.9% FLUSH
3.0000 mL | Freq: Two times a day (BID) | INTRAVENOUS | Status: DC
  Administered 2022-04-05 – 2022-04-07 (×4): 3 mL via INTRAVENOUS

## 2022-04-05 MED ORDER — FENTANYL CITRATE PF 50 MCG/ML IJ SOSY: 12.5000 ug | PREFILLED_SYRINGE | INTRAMUSCULAR | Status: DC | PRN

## 2022-04-05 MED ORDER — MUPIROCIN 2 % EX OINT
1.0000 | TOPICAL_OINTMENT | Freq: Two times a day (BID) | CUTANEOUS | Status: DC
  Administered 2022-04-05: 1 via NASAL
  Filled 2022-04-05: qty 22

## 2022-04-05 MED ORDER — ENSURE ENLIVE PO LIQD: 237.0000 mL | Freq: Two times a day (BID) | ORAL | Status: DC

## 2022-04-05 MED ORDER — HYDRALAZINE HCL 20 MG/ML IJ SOLN: 10.0000 mg | Freq: Four times a day (QID) | INTRAMUSCULAR | Status: DC | PRN

## 2022-04-05 MED ORDER — HYDROCODONE-ACETAMINOPHEN 5-325 MG PO TABS: 1.0000 | ORAL_TABLET | ORAL | Status: DC | PRN

## 2022-04-05 MED ORDER — ALUM & MAG HYDROXIDE-SIMETH 200-200-20 MG/5ML PO SUSP: 30.0000 mL | Freq: Four times a day (QID) | ORAL | Status: DC | PRN

## 2022-04-05 NOTE — H&P (Signed)
History and Physical    Patient: Mason Stark RWE:[id number] DOB: [birth year] DOA: 04/05/2022 DOS: the patient was seen and examined on 04/05/2022 PCP: Zoila Shutter, NP  Patient coming from: Outside Hospital  Chief Complaint: No chief complaint on file.  HPI: Mason Stark is a 81 y.o. male with medical history significant of HTN, GERD, neurogenic bladder, BPH-history of remote cholecystectomy in the 1990s-who presented to the emergency room at [facility] for evaluation of 5-day history of epigastric pain.  Per patient-for the past 5 days or so-he has had epigastric pain that he describes as burning in nature.  Pain is at x10/10 in intensity-and is almost always constant-though its intensity waxes and wanes.  There is no history of nausea vomiting.  No history of diarrhea.  Pain does not radiate anywhere else.  He presented to [facility] emergency room with these complaints-weight was found to have acute urinary retention-Foley catheter was placed.  CT abdomen demonstrated choledocholithiasis-this was confirmed on MRCP.  Case was discussed with GI on-call here at [facility] patient was subsequently transferred to [facility] for ERCP/GI evaluation.  While at [facility] was found to have a minimally elevated lipase, and a sodium level of 125.  No history of fever, no jaundice, no headache.  No chest pain or shortness of breath.  No nausea, vomiting or diarrhea.  No burning micturition.  No frequency of urination.   Review of Systems: As mentioned in the history of present illness. All other systems reviewed and are negative. 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 Hiatal hernia. Incidental gastric polyp status polypectomy x3   HERNIA REPAIR     KIDNEY STONE SURGERY  [id number]   complications, ureteral injury, multiple surgeries were required   POLYPECTOMY     UPPER GASTROINTESTINAL ENDOSCOPY     Social History:  reports that he has quit smoking. He has never used smokeless tobacco. He reports that he does not drink alcohol and does not use drugs.  Allergies  Allergen Reactions   Clindamycin/Lincomycin     Passed out    Doxycycline     Constipation    Nitroglycerin     Passed out- blood pressure Drop    Trazodone And Nefazodone     Vasovagal response- vomiting     Family History  Problem Relation Age of Onset   Heart failure Mother    Colon cancer Mother 14       died age 80   Colon polyps Neg Hx    Esophageal cancer Neg Hx    Rectal cancer Neg Hx    Prostate cancer Neg Hx     Prior to Admission medications   Medication Sig Start Date End Date Taking? Authorizing Provider  amLODipine (NORVASC) 10 MG tablet Take 10 mg by mouth daily.    [provider]  aspirin EC 81 MG tablet Take 81  mg by mouth daily.    [provider]  doxycycline (VIBRAMYCIN) 100 MG capsule Take 100 mg by mouth every 12 (twelve) hours. 10 day course started 12/19/17 am    [provider]  dutasteride (AVODART) 0.5 MG capsule Take 0.5 mg by mouth daily.    [provider]  lisinopril-hydrochlorothiazide (PRINZIDE,ZESTORETIC) 20-12.5 MG tablet Take 1 tablet by mouth daily.    [provider]  omeprazole (PRILOSEC) 20 MG capsule Take 20 mg by mouth daily. Patient not taking: Reported on 04/21/2020    [provider]  omeprazole (PRILOSEC) 40 MG capsule  03/25/20   [provider]   Vitamin A 2400 MCG (8000 UT) CAPS Take by mouth.    [provider]  Vitamin D, Ergocalciferol, (DRISDOL) 1.25 MG (50000 UNIT) CAPS capsule  12/07/19   [provider]    Physical Exam: There were no vitals filed for this visit. There is no height or weight on file to calculate BMI.   Gen Exam:Alert awake-not in any distress HEENT:atraumatic, normocephalic Chest: B/L clear to auscultation anteriorly CVS:S1S2 regular Abdomen:soft non tender, non distended Extremities:no edema Neurology: Non focal Skin: no rash   Data Reviewed: At [facility] health Lipase 357 > 693 AST 50 > 90 ALT 36 >53 T. Bili 1.2 NA: 125  CT abdomen/MRCP: Confirmed small CBD stone.   Assessment and Plan: Choledocholithiasis: Await GI input regarding timing of ERCP.  Does not appear to have any signs of cholangitis.  Supportive care-full liquids for now-await GI recommendations.  Hyponatremia: Seems euvolemic-no history of GI loss-has numerous lung nodules-suspicion for SIADH.  Unclear to me whether she is on a HCTZ diuretic.  Will await medication reconciliation.  Apparently has received several liters of IV fluid at [facility] ED-hold further hydration-checking repeat labs/serum osmolality etc.  Will develop treatment plan based on lab data.  Constipation: Significant stool burden seen on imaging studies-starting scheduled MiraLAX/senna-we will utilize Dulcolax suppository for moderate constipation-and Fleet enema for severe constipation.  HTN: Resume amlodipine-hold lisinopril for now-follow BP trend and adjust medications accordingly.  Lung nodules (most severe 4 mm left lung nodule): Seen incidentally on CT imaging-we will need repeat imaging in 6 to 12 months at the discretion of PCP  GERD: Continue PPI  Acute urinary retention: Found to have distended bladder at [facility]-Foley placed in the ED.  Continue for now.  Patient has known history of neurogenic bladder and self catheterizes  at least 2-3 times a day.  BPH: Continue dutasteride.   Advance Care Planning:   Code Status: Full Code   Consults: GI  Family Communication: Spouse at bedside  Severity of Illness: The appropriate patient status for this patient is INPATIENT. Inpatient status is judged to be reasonable and necessary in order to provide the required intensity of service to ensure the patient's safety. The patient's presenting symptoms, physical exam findings, and initial radiographic and laboratory data in the context of their chronic comorbidities is felt to place them at high risk for further clinical deterioration. Furthermore, it is not anticipated that the patient will be medically stable for discharge from the hospital within 2 midnights of admission.   * I certify that at the point of admission it is my clinical judgment that the patient will require inpatient hospital care spanning beyond 2 midnights from the point of admission due to high intensity of service, high risk for further deterioration and high frequency of surveillance required.*  Author: Oren Binet, MD 04/05/2022 4:42 PM  For  on call review [email or web address].

## 2022-04-05 NOTE — Consult Note (Addendum)
Referring Provider: Charlies Silvers, DO Primary Care Physician:  Zoila Shutter, NP Primary Gastroenterologist:  Dr. Lyndel Safe  Reason for Consultation: Choledocholithiasis, elevated lipase and LFTs  HPI: Mason Stark is a 81 y.o. male with a past medical history of anxiety, depression, hypertension, hyperlipidemia, CKD, GERD and a tubulovillous adenoma.  He developed periumbilical 3 to 4 days ago.  No nausea or vomiting.  His abdominal pain worsened therefore he presented to [facility] ED earlier this morning.  Labs in the ED showed a WBC count of 13.5. Hemoglobin 37.7. Platelet 306.  Sodium 122.  Potassium 3.9. Creatinine 0.80.  Calcium 9.2. Total bili 1.2.  Alk phos 55.  AST 50.  ALT 36.  Lipase 357.  Albumin 4.0.  Troponin less than 0.1.  Repeat labs at 8:49 AM: Sodium 125.  Potassium 4.6.  Total bili 1.3.  Alk phos 44.  AST 19.  ALT 53.  Lipase 693.  An abdominal/pelvic CTA showed A calcified stone within the distal CBD measuring 73m and circumferential wall thickening involving the mid and distal esophagus which may reflect sequelae of chronic esophagitis. An abdominal MRI/MRCP with and without contrast showed evidence of past cholecystectomy with mild intra and extrahepatic biliary ductal dilatation, common bile duct measuring up to 0.8 cm no evidence of choledocholithiasis as seen by CT imaging.  [facility]  He continues to have periumbilical pain.  No nausea or vomiting.  He has chronic constipation and passes bowel movement every few days.  No rectal bleeding or black stools.  He is not on any anticoagulation.  He takes ASA 81 mg daily.  No dysphagia or heartburn on Omeprazole 20 mg daily.  No history of cardiac or pulmonary disease.  He has lost 20 pounds over the past year.  No fever, sweats or chills.  Alcohol use.  Non-smoker.  His wife is at the bedside.  Chest/abdominal/pelvic CTA   04/05/2022: EXAM: CT ANGIOGRAPHY CHEST, ABDOMEN AND PELVIS  TECHNIQUE: Non-contrast CT of the chest was initially obtained.  Multidetector CT imaging through the chest, abdomen and pelvis was performed using the standard protocol during bolus administration of intravenous contrast. Multiplanar reconstructed images and MIPs were obtained and reviewed to evaluate the vascular anatomy.  RADIATION DOSE REDUCTION: This exam was performed according to the departmental dose-optimization program which includes automated exposure control, adjustment of the mA and/or kV according to patient size and/or use of iterative reconstruction technique.  CONTRAST: 80 cc of Isovue 370  COMPARISON: 02/24/2022  FINDINGS: CTA CHEST FINDINGS  Cardiovascular: Preferential opacification of the thoracic aorta. No evidence of thoracic aortic aneurysm or dissection. Aortic atherosclerosis and coronary artery calcifications noted. Normal heart size. No pericardial effusion.  Mediastinum/Nodes: Thyroid gland and trachea demonstrate no significant findings. Circumferential wall thickening involving the mid and distal esophagus is identified, image 104/4.  No enlarged mediastinal or hilar lymph nodes.  Lungs/Pleura: No pleural effusion, airspace consolidation, atelectasis, or pneumothorax. Cluster of nodules within the periphery of the left lower lobe are noted measuring up to 4 mm, image 101/4. This is unchanged when compared with the previous exam.  Musculoskeletal: No chest wall abnormality. No acute or significant osseous findings.  Review of the MIP images confirms the above findings.  CTA ABDOMEN AND PELVIS FINDINGS  VASCULAR  Aorta: Normal caliber aorta without aneurysm, dissection, vasculitis or significant stenosis. Aortic atherosclerotic calcifications identified.  Celiac: Patent without evidence of aneurysm, dissection, vasculitis or significant stenosis.  SMA: Patent without evidence  of aneurysm, dissection, vasculitis or significant stenosis.  Renals: Both renal arteries are patent without evidence of aneurysm, dissection, vasculitis, fibromuscular dysplasia or significant stenosis.  IMA: Patent without evidence of aneurysm, dissection, vasculitis or significant stenosis.  Inflow: Patent without evidence of aneurysm, dissection, vasculitis or significant stenosis.  Veins: No obvious venous abnormality within the limitations of this arterial phase study.  Review of the MIP images confirms the above findings.  NON-VASCULAR  Hepatobiliary: No suspicious liver lesions. Unchanged too small to characterize 6 mm low-density structure within left hepatic lobe, image 12/5.  Cholecystectomy. Mild increase caliber of the intrahepatic bile ducts appears increased from previous exam. The common bile duct measures up to 9 mm on today's exam. Formally 7 mm. A calcified stone within the distal CBD is suspected measuring 3 mm, image 153/4 and image 44/604.Marland Kitchen  Pancreas: Unremarkable. No pancreatic ductal dilatation or surrounding inflammatory changes.  Spleen: Normal in size without focal abnormality. Calcified granulomas noted.  Adrenals/Urinary Tract: Normal adrenal glands. No suspicious kidney mass or nephrolithiasis identified. Mild left hydronephrosis and proximal hydroureter is unchanged from previous exam. No obstructing stone identified. Urinary bladder is decompressed around a Foley catheter balloon.  Stomach/Bowel: Stomach is unremarkable. The appendix is not confidently identified separate from the right lower quadrant bowel loops. No bowel wall thickening, inflammation, or distension. A large stool burden is identified throughout the colon and rectum.  Lymphatic: No signs of abdominopelvic adenopathy.  Reproductive: Mild prostate gland enlargement.  Other: No free fluid or fluid collections. No signs of pneumoperitoneum.  Musculoskeletal: No acute  or significant osseous findings.  Review of the MIP images confirms the above findings.  IMPRESSION: 1. No evidence for aortic dissection or aneurysm. 2. There is progressive increase caliber of the intrahepatic bile ducts and common bile duct. A calcified stone within the distal CBD is suspected measuring 3 mm. 3. There is a large stool burden identified throughout the colon and rectum which may reflect constipation. 4. Circumferential wall thickening involving the mid and distal esophagus which may reflect sequelae of chronic esophagitis. 5. Stable appearance of mild left-sided hydronephrosis and proximal hydroureter. Etiology remains indeterminate. No obstructing stone identified. Consider further evaluation with nonemergent hematuria protocol CT of the abdomen and pelvis. 6. Multiple pulmonary nodules. Most severe: 4 mm left solid pulmonary nodule.Per Fleischner Society Guidelines, no routine follow-up imaging is recommended. These guidelines do not apply to immunocompromised patients and patients with cancer. Follow up in patients with significant comorbidities as clinically warranted. For lung cancer screening, adhere to Lung-RADS guidelines. Reference: Radiology. 2017; 284(1):228-43. 7. Aortic Atherosclerosis (ICD10-I70.0).    Abdominal MRI/MRCP with and without contrast 04/05/2022: Multiplanar multisequence MR imaging of the abdomen was performed both before and after the administration of intravenous contrast.  CONTRAST: 7 mL Gadavist gadolinium contrast IV  COMPARISON: CT angiogram chest abdomen pelvis, 04/05/2022  FINDINGS: Lower chest: No acute findings.  Hepatobiliary: No mass or other parenchymal abnormality identified. Status post cholecystectomy. Mild intra and extrahepatic biliary ductal dilatation, common bile duct measuring up to 0.8 cm. A previously queried small calculus in the distal common bile duct is not clearly appreciated by MR (series 3, image 16).  Technical note, the ampulla is not fully included on MRCP sequences submitted for review (series 14).  Pancreas: No mass, inflammatory changes, or other parenchymal abnormality identified.No pancreatic ductal dilatation.  Spleen: Within normal limits in size and appearance.  Adrenals/Urinary Tract: Normal adrenal glands. No renal masses or suspicious contrast enhancement  identified. Unchanged, mild left hydronephrosis and proximal hydroureter (series 6, image 19).  Stomach/Bowel: Visualized portions within the abdomen are unremarkable. Large burden of stool throughout the included colon.  Vascular/Lymphatic: No pathologically enlarged lymph nodes identified. No abdominal aortic aneurysm demonstrated.  Other: None.  Musculoskeletal: No suspicious osseous lesions identified.  IMPRESSION: 1. Status post cholecystectomy. Mild intra and extrahepatic biliary ductal dilatation, common bile duct measuring up to 0.8 cm. 2. A small calculus in the distal common bile duct identified by prior CT is not clearly appreciated by MR. Technical note, the ampulla is not fully included on MRCP sequences submitted for review. 3. Unchanged, mild left hydronephrosis and proximal hydroureter. No obstructing etiology identified on this examination of the abdomen only. 4. Large burden of stool throughout the included colon.   PAST GI PROCEDURES:  EGD 10/16/2017: Schatzki's ring status post esophageal dilatation Hiatal hernia Incidental gastric polyp status post polypectomy x 3  Colonoscopy 07/2016: 1 cm tubular adenoma status post polypectomy, moderate predominantly sigmoid diverticulosis.  Limited prep. Repeat in 3 years 07/2019 with a 2-day prep.  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 Hiatal hernia. Incidental gastric polyp status polypectomy x3   HERNIA REPAIR     KIDNEY STONE SURGERY  9381   complications, ureteral injury, multiple surgeries were required   POLYPECTOMY     UPPER GASTROINTESTINAL ENDOSCOPY      Prior to Admission medications   Medication Sig Start Date End Date Taking? Authorizing Provider  amLODipine (NORVASC) 10 MG tablet Take 10 mg by mouth daily.    [provider]  aspirin EC 81 MG tablet Take 81 mg by mouth daily.    [provider]  doxycycline (VIBRAMYCIN) 100 MG capsule Take 100 mg by mouth every 12 (twelve) hours. 10 day course started 12/19/17 am    [provider]  dutasteride (AVODART) 0.5 MG capsule Take 0.5 mg by mouth daily.    [provider]  lisinopril-hydrochlorothiazide (PRINZIDE,ZESTORETIC) 20-12.5 MG tablet Take 1 tablet by mouth daily.    [provider]  omeprazole (PRILOSEC) 20 MG capsule Take 20 mg by mouth daily. Patient not taking: Reported on 04/21/2020    [provider]  omeprazole (PRILOSEC) 40 MG capsule  03/25/20   [provider]  Vitamin A 2400 MCG (8000 UT) CAPS Take by mouth.    [provider]  Vitamin D, Ergocalciferol, (DRISDOL) 1.25 MG (50000 UNIT) CAPS capsule  12/07/19   [provider]    No current facility-administered medications for this encounter.    Allergies as of 04/05/2022 - Review Complete 11/25/2021  Allergen Reaction Noted   Clindamycin/lincomycin  04/21/2020   Doxycycline  04/21/2020   Nitroglycerin   04/21/2020   Trazodone and nefazodone  04/21/2020    Family  History  Problem Relation Age of Onset   Heart failure Mother    Colon cancer Mother 26       died age 55   Colon polyps Neg Hx    Esophageal cancer Neg Hx    Rectal cancer Neg Hx    Prostate cancer Neg Hx     Social History   Socioeconomic History   Marital status: Married    Spouse name: Not on file   Number of children: Not on file   Years of education: Not on file   Highest education level: Not on file  Occupational History   Not on file  Tobacco Use   Smoking status: Former   Smokeless tobacco: Never   Tobacco comments:    year or two as teenager  Substance and Sexual Activity   Alcohol use: Never   Drug use: Never   Sexual activity: Not on file  Other Topics Concern   Not on file  Social History Narrative   Not on file   Social Determinants of Health   Financial Resource Strain: Not on file  Food Insecurity: Not on file  Transportation Needs: Not on file  Physical Activity: Not on file  Stress: Not on file  Social Connections: Not on file  Intimate Partner Violence: Not on file   Review of Systems: Gen: 81 year old male fatigued appearing in NAD. CV: Denies chest pain, palpitations or edema. Resp: Denies cough, shortness of breath of hemoptysis.  GI: See HPI.   GU: Decreased urine flow.  MS: Denies joint pain, muscles aches or weakness. Derm: Denies rash, itchiness, skin lesions or unhealing ulcers. Psych: Denies depression, anxiety or memory loss. Heme: Denies easy bruising, bleeding. Neuro:  Denies headaches, dizziness or paresthesias. Endo:  Denies any problems with DM, thyroid or adrenal function.  Physical Exam: Vital signs in last 24 hours: BP (!) 151/74   Pulse 68   Temp 98.3 F (36.8 C) (Oral)   Resp 17   Ht 5' 8"  (1.727 m)   Wt 58.6 kg   SpO2 100%   BMI 19.64 kg/m   General: 81 year old male fatigued in no acute distress. Head:  Normocephalic and atraumatic. Eyes:   No scleral icterus. Conjunctiva pink. Ears:  Normal auditory acuity. Nose:  No deformity, discharge or lesions. Mouth:  Dentition intact. No ulcers or lesions.  Neck:  Supple. No lymphadenopathy or thyromegaly.  Lungs: Breath sounds clear throughout. Heart: Regular rate and rhythm, no murmur. Abdomen: Soft, nondistended.  Mild tenderness without rebound or guarding.  Positive bowel sounds to all 4 quadrants. Rectal: Deferred. Musculoskeletal:  Symmetrical without gross deformities.  Pulses:  Normal pulses noted. Extremities:  Without clubbing or edema. Neurologic:  Alert and  oriented x 4. No focal deficits.  Skin:  Intact without significant lesions or rashes. Psych:  Alert and cooperative. Normal mood and affect.  Intake/Output from previous day: No intake/output data recorded. Intake/Output this shift: No intake/output data recorded.  Lab Results: No results for input(s): "WBC", "HGB", "HCT", "PLT" in the last 72 hours. BMET No results for input(s): "NA", "K", "CL", "CO2", "GLUCOSE", "BUN", "CREATININE", "CALCIUM" in the last 72 hours. LFT No results for input(s): "PROT", "ALBUMIN", "AST", "ALT", "ALKPHOS", "BILITOT", "BILIDIR", "IBILI" in the last 72 hours. PT/INR No results for input(s): "LABPROT", "INR" in the last 72 hours. Hepatitis Panel No results for input(s): "HEPBSAG", "HCVAB", "HEPAIGM", "HEPBIGM" in the last 72 hours.    Studies/Results: No results found.  IMPRESSION/PLAN:  1)  81 year old male with periumbilical abdominal pain, elevated LFTs and lipase levels who presented to [facility] ED earlier today. CTAP showed evidence of choledocholithiasis in the distal CBD.  An abdominal MRI/MRCP w/wo contrast showed evidence of a past cholecystectomy with mild intra and extrahepatic biliary ductal dilatation, CBD diameter up to 0.8 cm without evidence of choledocholithiasis or pancreatitis.  It is possible he had choledocholithiasis which passed  spontaneously. -Clear liquid diet -NPO after midnight -IV Fluids and pain management per hospitalist -Ondansetron 4 mg p.o. or IV every 6 hours as needed -ERCP deferred for now.  Repeat hepatic panel and lipase level in a.m, if LFTs and lipase levels rising we will reassess the need for ERCP -Await further recommendations per Dr. Rush Landmark  2) GERD. CTA showed circumferential wall thickening involving the mid and distal esophagus which may reflect sequelae of chronic esophagitis. -Pantoprazole 40 mg IV QD -For recommendations for possible EGD during this hospitalization to Dr. Rush Landmark  3) History of advanced colon polyps, tubulovillous adenoma -No further colon polyp surveillance colonoscopies due to age  67) Pulmonary nodules per CTA -Follow-up with PCP for further evaluation         Noralyn Pick  04/05/2022, 5:49PM

## 2022-04-05 NOTE — Plan of Care (Addendum)
Plan of Care Note for accepted transfer   Patient: Mason [facility] MRN: [id number]   DOA: (Not on file)  Facility requesting transfer: [facility] Requesting Provider: Charlies Silvers, DO Reason for transfer: Common bile duct stone requiring ERCP; no GI available until next week Facility course: Patient presented with epigastric pain. History of cholecystectomy in 63s. CT imaging identified a CBD stone measuring 3 cm. [facility] (Dr. Rush Landmark) consulted for consideration of ERCP at [facility]. Patient also with recurrent urinary retention; foley catheter inserted.  Labs:  Lipase 357 > 693 AST 50 > 90 ALT 36 >53 T. Bili 1.2  Plan of care: The patient is accepted for admission to [place] unit, at [facility]. Patient will require notification of [facility] service once he arrives. Possible ERCP on 8/4 if this can be accommodated (busy scheduled) versus over the weekend.  Author: Cordelia Poche, MD 04/05/2022  Check [email or web address] for on-call coverage.  Nursing staff, Please call [place] number on Amion as soon as patient's arrival, so appropriate admitting provider can evaluate the pt.

## 2022-04-06 ENCOUNTER — Encounter (HOSPITAL_COMMUNITY): Payer: Self-pay | Admitting: Internal Medicine

## 2022-04-06 ENCOUNTER — Inpatient Hospital Stay (HOSPITAL_COMMUNITY): Payer: Medicare PPO | Admitting: Certified Registered Nurse Anesthetist

## 2022-04-06 ENCOUNTER — Encounter (HOSPITAL_COMMUNITY)
Admission: AD | Disposition: A | Discharge status: Home / Self Care | Payer: Self-pay | Source: Other Acute Inpatient Hospital | Attending: Internal Medicine

## 2022-04-06 DIAGNOSIS — K449 Diaphragmatic hernia without obstruction or gangrene: Secondary | ICD-10-CM | POA: Diagnosis not present

## 2022-04-06 DIAGNOSIS — R338 Other retention of urine: Secondary | ICD-10-CM | POA: Diagnosis not present

## 2022-04-06 DIAGNOSIS — K2980 Duodenitis without bleeding: Secondary | ICD-10-CM

## 2022-04-06 DIAGNOSIS — K222 Esophageal obstruction: Secondary | ICD-10-CM

## 2022-04-06 DIAGNOSIS — K317 Polyp of stomach and duodenum: Secondary | ICD-10-CM

## 2022-04-06 DIAGNOSIS — K5900 Constipation, unspecified: Secondary | ICD-10-CM | POA: Diagnosis not present

## 2022-04-06 DIAGNOSIS — R1310 Dysphagia, unspecified: Secondary | ICD-10-CM

## 2022-04-06 DIAGNOSIS — K219 Gastro-esophageal reflux disease without esophagitis: Secondary | ICD-10-CM | POA: Diagnosis not present

## 2022-04-06 DIAGNOSIS — K8050 Calculus of bile duct without cholangitis or cholecystitis without obstruction: Secondary | ICD-10-CM | POA: Diagnosis not present

## 2022-04-06 HISTORY — PX: BALLOON DILATION: SHX5330

## 2022-04-06 HISTORY — PX: BIOPSY: SHX5522

## 2022-04-06 HISTORY — PX: ESOPHAGOGASTRODUODENOSCOPY: SHX5428

## 2022-04-06 LAB — COMPREHENSIVE METABOLIC PANEL
ALT: 34 U/L (ref 0–44)
AST: 33 U/L (ref 15–41)
Albumin: 2.9 g/dL — ABNORMAL LOW (ref 3.5–5.0)
Alkaline Phosphatase: 43 U/L (ref 38–126)
Anion gap: 6 (ref 5–15)
BUN: 10 mg/dL (ref 8–23)
CO2: 24 mmol/L (ref 22–32)
Calcium: 8.6 mg/dL — ABNORMAL LOW (ref 8.9–10.3)
Chloride: 101 mmol/L (ref 98–111)
Creatinine, Ser: 0.88 mg/dL (ref 0.61–1.24)
GFR, Estimated: >60 mL/min (ref >60)
Glucose, Bld: 92 mg/dL (ref 70–99)
Potassium: 3.8 mmol/L (ref 3.5–5.1)
Sodium: 131 mmol/L — ABNORMAL LOW (ref 135–145)
Total Bilirubin: 1.3 mg/dL — ABNORMAL HIGH (ref 0.3–1.2)
Total Protein: 5.1 g/dL — ABNORMAL LOW (ref 6.5–8.1)

## 2022-04-06 LAB — LIPASE, BLOOD: Lipase: 33 U/L (ref 11–51)

## 2022-04-06 LAB — CBC
HCT: 33.3 % — ABNORMAL LOW (ref 39.0–52.0)
Hemoglobin: 12.1 g/dL — ABNORMAL LOW (ref 13.0–17.0)
MCH: 32.4 pg (ref 26.0–34.0)
MCHC: 36.3 g/dL — ABNORMAL HIGH (ref 30.0–36.0)
MCV: 89 fL (ref 80.0–100.0)
Platelets: 261 10*3/uL (ref 150–400)
RBC: 3.74 MIL/uL — ABNORMAL LOW (ref 4.22–5.81)
RDW: 12.5 % (ref 11.5–15.5)
WBC: 6.8 10*3/uL (ref 4.0–10.5)
nRBC: 0 % (ref 0.0–0.2)

## 2022-04-06 LAB — BILIRUBIN, DIRECT: Bilirubin, Direct: 0.2 mg/dL (ref 0.0–0.2)

## 2022-04-06 LAB — SURGICAL PCR SCREEN
MRSA, PCR: NEGATIVE
Staphylococcus aureus: NEGATIVE

## 2022-04-06 SURGERY — EGD (ESOPHAGOGASTRODUODENOSCOPY)
Anesthesia: Monitor Anesthesia Care

## 2022-04-06 MED ORDER — ADULT MULTIVITAMIN W/MINERALS CH
1.0000 | ORAL_TABLET | Freq: Every day | ORAL | Status: DC
  Administered 2022-04-07: 1 via ORAL
  Filled 2022-04-06: qty 1

## 2022-04-06 MED ORDER — PROPOFOL 10 MG/ML IV BOLUS
INTRAVENOUS | Status: DC | PRN
  Administered 2022-04-06 (×2): 10 mg via INTRAVENOUS
  Administered 2022-04-06: 20 mg via INTRAVENOUS
  Administered 2022-04-06: 10 mg via INTRAVENOUS

## 2022-04-06 MED ORDER — LACTATED RINGERS IV SOLN: INTRAVENOUS | Status: DC | PRN

## 2022-04-06 MED ORDER — LIDOCAINE 2% (20 MG/ML) 5 ML SYRINGE
INTRAMUSCULAR | Status: DC | PRN
  Administered 2022-04-06: 20 mg via INTRAVENOUS
  Administered 2022-04-06: 40 mg via INTRAVENOUS

## 2022-04-06 MED ORDER — CHLORHEXIDINE GLUCONATE CLOTH 2 % EX PADS
6.0000 | MEDICATED_PAD | Freq: Every day | CUTANEOUS | Status: DC
  Administered 2022-04-07: 6 via TOPICAL

## 2022-04-06 MED ORDER — PROPOFOL 500 MG/50ML IV EMUL
INTRAVENOUS | Status: DC | PRN
  Administered 2022-04-06: 100 ug/kg/min via INTRAVENOUS

## 2022-04-06 MED ORDER — ENSURE ENLIVE PO LIQD
237.0000 mL | Freq: Three times a day (TID) | ORAL | Status: DC
  Administered 2022-04-07: 237 mL via ORAL

## 2022-04-06 NOTE — Evaluation (Signed)
Physical Therapy Evaluation Patient Details Name: Mason [facility] MRN: [id number] DOB: December 29, 1940 Today's Date: 04/06/2022  History of Present Illness  Patient is a 81 y/o male who presents as tx from [facility] on 04/05/22 with epigastric pain and recurrent urinary retention. Admitted with choledocholithiasis and hyponatremia. Plan for possible ERCP 04/06/22. PMH includes CKD, depression, HTN, neurogenic bladder, BPH, hx of cholecystectomy in the 1990s.  Clinical Impression  Patient presents with generalized weakness, mild balance deficits and impaired mobility s/p above. Pt lives at home with spouse and reports being independent for ADLs and IADLs at baseline. Does endorse hx of falls. Today, pt requires supervision-Mod I for transfers, bed mobility and gait training with shuffling like gait and mild sway but no overt LOB. Reports feeling slightly stronger today. Encouraged increasing activity and walking while in the hospital with assist. Will plan for stair training next session as tolerated. Will follow acutely to maximize independence and mobility prior to return home.       Recommendations for follow up therapy are one component of a multi-disciplinary discharge planning process, led by the attending physician.  Recommendations may be updated based on patient status, additional functional criteria and insurance authorization.  Follow Up Recommendations No PT follow up      Assistance Recommended at Discharge PRN  Patient can return home with the following  Help with stairs or ramp for entrance;Assistance with cooking/housework;Assist for transportation;A little help with walking and/or transfers    Equipment Recommendations None recommended by PT  Recommendations for Other Services       Functional Status Assessment Patient has had a recent decline in their functional status and demonstrates the ability to make significant improvements in function in a reasonable and predictable amount  of time.     Precautions / Restrictions Precautions Precautions: Fall Restrictions Weight Bearing Restrictions: No      Mobility  Bed Mobility Overal bed mobility: Modified Independent             General bed mobility comments: No assist needed, HOB low.    Transfers Overall transfer level: Needs assistance Equipment used: None Transfers: Sit to/from Stand Sit to Stand: Supervision           General transfer comment: Supervision for safety. Stood from [employer].    Ambulation/Gait Ambulation/Gait assistance: Supervision Gait Distance (Feet): 300 Feet Assistive device: None Gait Pattern/deviations: Step-through pattern, Decreased stride length, Decreased step length - right, Decreased step length - left, Shuffle   Gait velocity interpretation: 1.31 - 2.62 ft/sec, indicative of limited community ambulator   General Gait Details: Slow, mildly unsteady and shuffling gait pattern with decreased foot clearance bilaterally. Mild sway noted but no overt LOB.  Stairs            Wheelchair Mobility    Modified Rankin (Stroke Patients Only)       Balance Overall balance assessment: Mild deficits observed, not formally tested                                           Pertinent Vitals/Pain Pain Assessment Pain Assessment: No/denies pain    Home Living Family/patient expects to be discharged to:: Private residence Living Arrangements: Spouse/significant other Available Help at Discharge: Family;Available 24 hours/day Type of Home: House Home Access: Stairs to enter Entrance Stairs-Rails: Right Entrance Stairs-Number of Steps: 1 flight, vs 3-4 Alternate Level Stairs-Number of Steps:  1 flight Home Layout: Two level Home Equipment: None      Prior Function Prior Level of Function : Independent/Modified Independent             Mobility Comments: Independent, drives, IADLs. Reports falls ADLs Comments: independent     Hand  Dominance   Dominant Hand: Right    Extremity/Trunk Assessment   Upper Extremity Assessment Upper Extremity Assessment: Defer to OT evaluation    Lower Extremity Assessment Lower Extremity Assessment: Generalized weakness (but functional)    Cervical / Trunk Assessment Cervical / Trunk Assessment: Kyphotic  Communication   Communication: No difficulties  Cognition Arousal/Alertness: Awake/alert Behavior During Therapy: WFL for tasks assessed/performed Overall Cognitive Status: Within Functional Limits for tasks assessed                                          General Comments General comments (skin integrity, edema, etc.): Wife present during session.    Exercises     Assessment/Plan    PT Assessment Patient needs continued PT services  PT Problem List Decreased strength;Decreased mobility;Decreased balance       PT Treatment Interventions Therapeutic activities;Gait training;Therapeutic exercise;Stair training;Balance training;Patient/family education;DME instruction    PT Goals (Current goals can be found in the Care Plan section)  Acute Rehab PT Goals Patient Stated Goal: to get better and go home PT Goal Formulation: With patient/family Time For Goal Achievement: 04/20/22 Potential to Achieve Goals: Good    Frequency Min 3X/week     Co-evaluation               AM-PAC PT "6 Clicks" Mobility  Outcome Measure Help needed turning from your back to your side while in a flat bed without using bedrails?: None Help needed moving from lying on your back to sitting on the side of a flat bed without using bedrails?: None Help needed moving to and from a bed to a chair (including a wheelchair)?: A Little Help needed standing up from a chair using your arms (e.g., wheelchair or bedside chair)?: A Little Help needed to walk in hospital room?: A Little Help needed climbing 3-5 steps with a railing? : A Little 6 Click Score: 20    End of  Session Equipment Utilized During Treatment: Gait belt Activity Tolerance: Patient tolerated treatment well Patient left: in bed;with call bell/phone within reach;with family/visitor present Nurse Communication: Mobility status PT Visit Diagnosis: Difficulty in walking, not elsewhere classified (R26.2);Muscle weakness (generalized) (M62.81);Unsteadiness on feet (R26.81)    Time: 6468-0321 PT Time Calculation (min) (ACUTE ONLY): 15 min   Charges:   PT Evaluation $PT Eval Low Complexity: 1 Low          Marisa Severin, PT, DPT Acute Rehabilitation Services Secure chat preferred Office [place] 04/06/2022, 10:23 AM

## 2022-04-06 NOTE — Anesthesia Procedure Notes (Signed)
Procedure Name: MAC Date/Time: 04/06/2022 12:58 PM  Performed by: Dorthea Cove, CRNAPre-anesthesia Checklist: Patient identified, Emergency Drugs available, Suction available, Timeout performed and Patient being monitored Patient Re-evaluated:Patient Re-evaluated prior to induction Oxygen Delivery Method: Nasal cannula Preoxygenation: Pre-oxygenation with 100% oxygen Induction Type: IV induction Placement Confirmation: positive ETCO2 and CO2 detector Dental Injury: Teeth and Oropharynx as per pre-operative assessment

## 2022-04-06 NOTE — Progress Notes (Signed)
PROGRESS NOTE        PATIENT DETAILS Name: Mason [facility] Age: 81 y.o. Sex: male Date of Birth: October 28, 1940 Admit Date: 04/05/2022 Admitting Physician Evalee Mutton Kristeen Mans, MD JQB:HALPFX, Orlando Penner, NP  Brief Summary: Patient is a 81 y.o.  male with history of HTN, GERD, neurogenic bladder, BPH-who presented to [place] ED on 8/3 with worsening epigastric pain-he was found to have choledocholithiasis and transferred to [facility] for GI evaluation.   Significant events: 8/3>> transfer from [facility] to [facility] for evaluation of choledocholithiasis.  Significant studies: 8/3>> CT abdomen: Calcified stone in the distal CBD, large stool burden, circumferential mid/distal thickening of esophagus.  Multiple pulmonary nodules.  Most severe 4 mm left solid nodule. 8/3>> MRCP: S/p cholecystectomy-mild intra-/extrahepatic biliary ductal dilatation, small calculi in the distal CBD identified by prior CT is not clearly appreciated.  Large stool burden.  Significant microbiology data: None  Procedures: None  Consults: GI  Subjective: Epigastric pain has improved.  Had BM earlier this morning.  Objective: Vitals: Blood pressure 126/68, pulse 68, temperature 98.5 F (36.9 C), temperature source Oral, resp. rate 15, height '5\' 8"'$  (1.727 m), weight 58.6 kg, SpO2 100 %.   Exam: Gen Exam:Alert awake-not in any distress HEENT:atraumatic, normocephalic Chest: B/L clear to auscultation anteriorly CVS:S1S2 regular Abdomen:soft non tender, non distended Extremities:no edema Neurology: Non focal Skin: no rash  Pertinent Labs/Radiology:    Latest Ref Rng & Units 04/06/2022    7:01 AM 04/05/2022    6:01 PM 11/23/2021    7:11 AM  CBC  WBC 4.0 - 10.5 K/uL 6.8  10.9  6.5   Hemoglobin 13.0 - 17.0 g/dL 12.1  13.2  13.4   Hematocrit 39.0 - 52.0 % 33.3  35.5  37.4   Platelets 150 - 400 K/uL 261  276  220     Lab Results  Component Value Date   NA 131 (L) 04/06/2022   K 3.8 04/06/2022    CL 101 04/06/2022   CO2 24 04/06/2022      Assessment/Plan: Epigastric pain: CT showing distal esophageal thickening-on PPI-epigastric pain improved-for EGD later today.  Choledocholithiasis: ?  Passed-not seen on MRCP done yesterday-await further input from GI.     Hyponatremia: Received IVF at [facility] health-no IVF given since being here.  Unclear etiology but seems to have responded to IVF so could have had some amount of dehydration-although on my exam yesterday he was euvolemic.  Continue to follow for now.    Constipation: Significant stool burden seen on imaging studies-had BM this morning-continue MiraLAX/senna.     HTN: BP stable with amlodipine.  Continue to hold lisinopril   Lung nodules (most severe 4 mm left lung nodule): Seen incidentally on CT imaging-we will need repeat imaging in 6 to 12 months at the discretion of PCP   GERD: Continue PPI   Acute urinary retention: Found to have distended bladder at [facility]-Foley placed in the ED.  Continue for now.  Patient has known history of neurogenic bladder and self catheterizes at least 2-3 times a day.   BPH: Continue dutasteride.   BMI: Estimated body mass index is 19.64 kg/m as calculated from the following:   Height as of this encounter: '5\' 8"'$  (1.727 m).   Weight as of this encounter: 58.6 kg.   Code status:   Code Status: Full Code  DVT Prophylaxis: heparin injection 5,000 Units Start: 04/05/22 1730   Family Communication: None at bedside   Disposition Plan: Status is: Inpatient Remains inpatient appropriate because: Epigastric pain-esophageal thickening-question of choledocholithiasis-not yet stable for discharge.   Planned Discharge Destination:Home   Diet: Diet Order             Diet NPO time specified  Diet effective midnight                     Antimicrobial agents: Anti-infectives (From admission, onward)    None        MEDICATIONS: Scheduled Meds:  amLODipine  10 mg  Oral Daily   Chlorhexidine Gluconate Cloth  6 each Topical Daily   dutasteride  0.5 mg Oral Daily   feeding supplement  237 mL Oral BID BM   heparin  5,000 Units Subcutaneous Q8H   pantoprazole (PROTONIX) IV  40 mg Intravenous Q24H   polyethylene glycol  17 g Oral BID   senna  1 tablet Oral QHS   sodium chloride flush  3 mL Intravenous Q12H   Continuous Infusions:  sodium chloride     PRN Meds:.sodium chloride, acetaminophen **OR** acetaminophen, alum & mag hydroxide-simeth, bisacodyl, fentaNYL (SUBLIMAZE) injection, hydrALAZINE, HYDROcodone-acetaminophen, ondansetron **OR** ondansetron (ZOFRAN) IV, sodium chloride flush, sodium phosphate   I have personally reviewed following labs and imaging studies  LABORATORY DATA: CBC: Recent Labs  Lab 04/05/22 1801 04/06/22 0701  WBC 10.9* 6.8  HGB 13.2 12.1*  HCT 35.5* 33.3*  MCV 87.7 89.0  PLT 276 967    Basic Metabolic Panel: Recent Labs  Lab 04/05/22 1801 04/06/22 0701  NA 129* 131*  K 3.8 3.8  CL 99 101  CO2 23 24  GLUCOSE 99 92  BUN 10 10  CREATININE 0.82 0.88  CALCIUM 8.7* 8.6*    GFR: Estimated Creatinine Clearance: 56.4 mL/min (by C-G formula based on SCr of 0.88 mg/dL).  Liver Function Tests: Recent Labs  Lab 04/05/22 1801 04/06/22 0701  AST 48* 33  ALT 44 34  ALKPHOS 49 43  BILITOT 1.3* 1.3*  PROT 5.6* 5.1*  ALBUMIN 3.3* 2.9*   Recent Labs  Lab 04/06/22 0701  LIPASE 33   No results for input(s): "AMMONIA" in the last 168 hours.  Coagulation Profile: Recent Labs  Lab 04/05/22 1801  INR 1.0    Cardiac Enzymes: No results for input(s): "CKTOTAL", "CKMB", "CKMBINDEX", "TROPONINI" in the last 168 hours.  BNP (last 3 results) No results for input(s): "PROBNP" in the last 8760 hours.  Lipid Profile: No results for input(s): "CHOL", "HDL", "LDLCALC", "TRIG", "CHOLHDL", "LDLDIRECT" in the last 72 hours.  Thyroid Function Tests: Recent Labs    04/05/22 1801  TSH 1.270    Anemia  Panel: No results for input(s): "VITAMINB12", "FOLATE", "FERRITIN", "TIBC", "IRON", "RETICCTPCT" in the last 72 hours.  Urine analysis:    Component Value Date/Time   COLORURINE YELLOW 11/25/2021 1323   APPEARANCEUR CLEAR 11/25/2021 1323   LABSPEC <1.005 (L) 11/25/2021 1323   PHURINE 7.0 11/25/2021 1323   GLUCOSEU NEGATIVE 11/25/2021 1323   HGBUR NEGATIVE 11/25/2021 1323   BILIRUBINUR NEGATIVE 11/25/2021 1323   KETONESUR NEGATIVE 11/25/2021 1323   PROTEINUR NEGATIVE 11/25/2021 1323   NITRITE NEGATIVE 11/25/2021 1323   LEUKOCYTESUR NEGATIVE 11/25/2021 1323    Sepsis Labs: Lactic Acid, Venous No results found for: "LATICACIDVEN"  MICROBIOLOGY: Recent Results (from the past 240 hour(s))  Surgical PCR screen     Status: None   Collection Time:  04/05/22 10:14 PM   Specimen: Nasal Mucosa; Nasal Swab  Result Value Ref Range Status   MRSA, PCR NEGATIVE NEGATIVE Final   Staphylococcus aureus NEGATIVE NEGATIVE Final    Comment: (NOTE) The Xpert SA Assay (FDA approved for NASAL specimens in patients 61 years of age and older), is one component of a comprehensive surveillance program. It is not intended to diagnose infection nor to guide or monitor treatment. Performed at [facility], [place] [address], [place], [place] [address]     RADIOLOGY STUDIES/RESULTS: No results found.   LOS: 1 day   Oren Binet, MD  [facility]    To contact the attending provider between 7A-7P or the covering provider during after hours 7P-7A, please log into the web site [email or web address] and access using universal [place] password for that web site. If you do not have the password, please call the hospital operator.  04/06/2022, 10:53 AM

## 2022-04-06 NOTE — Anesthesia Postprocedure Evaluation (Signed)
Anesthesia Post Note  Patient: [facility]  Procedure(s) Performed: ESOPHAGOGASTRODUODENOSCOPY (EGD) BIOPSY BALLOON DILATION     Patient location during evaluation: Endoscopy Anesthesia Type: MAC Level of consciousness: awake and alert Pain management: pain level controlled Vital Signs Assessment: post-procedure vital signs reviewed and stable Respiratory status: spontaneous breathing, nonlabored ventilation, respiratory function stable and patient connected to nasal cannula oxygen Cardiovascular status: blood pressure returned to baseline and stable Postop Assessment: no apparent nausea or vomiting Anesthetic complications: no   No notable events documented.  Last Vitals:  Vitals:   04/06/22 1325 04/06/22 1335  BP: (!) 143/64 (!) 146/45  Pulse: 65 64  Resp: 15 11  Temp:    SpO2: 100% 97%    Last Pain:  Vitals:   04/06/22 1224  TempSrc:   PainSc: 0-No pain                 Barnet Glasgow

## 2022-04-06 NOTE — Discharge Instructions (Signed)
Nutrition Post Hospital Stay °Proper nutrition can help your body recover from illness and injury.   °Foods and beverages high in protein, vitamins, and minerals help rebuild muscle loss, promote healing, & reduce fall risk.  ° °•In addition to eating healthy foods, a nutrition shake is an easy, delicious way to get the nutrition you need during and after your hospital stay ° °It is recommended that you continue to drink 2 bottles per day of:       Ensure Plus for at least 1 month (30 days) after your hospital stay  ° °Tips for adding a nutrition shake into your routine: °As allowed, drink one with vitamins or medications instead of water or juice °Enjoy one as a tasty mid-morning or afternoon snack °Drink cold or make a milkshake out of it °Drink one instead of milk with cereal or snacks °Use as a coffee creamer °  °Available at the following grocery stores and pharmacies:           °* [employer] * [employer] * [employer]  °* [employer]          * [employer] * [employer]  °* Wal[employer]reens      * Target  * [employer]   °* [employer]  * [employer]   °* [facility] [phone]  °          °For COUPONS visit: www.ensure.com/join or www.[email or web address]/members/sign-[email or web address]  ° °Suggested Substitutions °Ensure Plus = Boost Plus = Carnation Breakfast Essentials = Boost Compact °Ensure Active Clear = Boost Breeze °Glucerna Shake = Boost Glucose Control = Carnation Breakfast Essentials SUGAR FREE ° °  °

## 2022-04-06 NOTE — Transfer of Care (Signed)
Immediate Anesthesia Transfer of Care Note  Patient: [facility]  Procedure(s) Performed: ESOPHAGOGASTRODUODENOSCOPY (EGD) BIOPSY BALLOON DILATION  Patient Location: Endoscopy Unit  Anesthesia Type:MAC  Level of Consciousness: drowsy  Airway & Oxygen Therapy: Patient Spontanous Breathing and Patient connected to nasal cannula oxygen  Post-op Assessment: Report given to RN and Post -op Vital signs reviewed and stable  Post vital signs: Reviewed and stable  Last Vitals:  Vitals Value Taken Time  BP 143/64 04/06/22 1325  Temp    Pulse 62 04/06/22 1325  Resp 15 04/06/22 1325  SpO2 99 % 04/06/22 1325  Vitals shown include unvalidated device data.  Last Pain:  Vitals:   04/06/22 1224  TempSrc:   PainSc: 0-No pain      Patients Stated Pain Goal: 0 (06/29/24 3664)  Complications: No notable events documented.

## 2022-04-06 NOTE — Op Note (Signed)
[facility] Patient Name: Mason Stark Procedure Date : 04/06/2022 MRN: [id number] Attending MD: Jackquline Denmark , MD Date of Birth: 1941/02/12 CSN: [id number] Age: 81 Admit Type: Inpatient Procedure:                Upper GI endoscopy Indications:              Dysphagia. Epi pain Providers:                Jackquline Denmark, MD, Doristine Johns, RN, Gloris Ham, Technician Referring MD:             Charlies Silvers Medicines:                Monitored Anesthesia Care Complications:            No immediate complications. Estimated Blood Loss:     Estimated blood loss: none. Procedure:                Pre-Anesthesia Assessment:                           - Prior to the procedure, a History and Physical                            was performed, and patient medications and                            allergies were reviewed. The patient's tolerance of                            previous anesthesia was also reviewed. The risks                            and benefits of the procedure and the sedation                            options and risks were discussed with the patient.                            All questions were answered, and informed consent                            was obtained. Prior Anticoagulants: The patient has                            taken no previous anticoagulant or antiplatelet                            agents. ASA Grade Assessment: II - A patient with                            mild systemic disease. After reviewing the risks  and benefits, the patient was deemed in                            satisfactory condition to undergo the procedure.                           After obtaining informed consent, the endoscope was                            passed under direct vision. Throughout the                            procedure, the patient's blood pressure, pulse, and                            oxygen saturations  were monitored continuously. The                            [id number]-H190 ([id number]) Olympus endoscope was introduced                            through the mouth, and advanced to the second part                            of duodenum. The upper GI endoscopy was                            accomplished without difficulty. The patient                            tolerated the procedure well. Scope In: Scope Out: Findings:      The esophagus was mildly torturous. One benign-appearing, intrinsic mod       (circumferential scarring or stenosis; an endoscope may barely pass)       stenosis was found at the gastroesophageal junction. This stenosis       measured 1 cm (inner diameter). The stenosis was traversed. Moderate       surrounding esophagitis. A TTS dilator was passed through the scope.       Dilation with a 12-13.5-15 mm balloon dilator was performed to 13.5 mm.       The dilation site was examined and showed moderate mucosal disruption       and moderate improvement in luminal narrowing. Biopsies were obtained       from the proximal and distal esophagus with cold forceps for histology       of suspected eosinophilic esophagitis.      A small hiatal hernia was present.      Multiple 6 to 10 mm semi-sessile polyps with no bleeding and no stigmata       of recent bleeding were found in the gastric body. Biopsies were taken       with a cold forceps for histology.      Scattered mild inflammation characterized by erythema was found in the       first portion of the duodenum and in the second portion of the duodenum.       Biopsies for histology were  taken with a cold forceps for evaluation of       celiac disease. Impression:               - Benign-appearing esophageal stenosis with                            surrounding esophagitis. Dilated. Biopsied.                           - Small hiatal hernia.                           - Multiple gastric polyps. Biopsied.                           -  Duodenitis. Biopsied. Recommendation:           - Patient has a contact number available for                            emergencies. The signs and symptoms of potential                            delayed complications were discussed with the                            patient. Return to normal activities tomorrow.                            Written discharge instructions were provided to the                            patient.                           - Soft diet today. Advance as tolerated in a.m.                           - Omeprazole 40 mg PO QD as outpatient. Can open                            capsule and put it in applesauce.                           - Await pathology results.                           - Chew foods especially meats and breads well and                            eat slowly.                           - Return to GI clinic in 4-6 weeks for                            consideration  of repeat dilatation                           - The findings and recommendations were discussed                            with the patient and their family. From                            LFTs/lipase, negative MRCP-it appears that he has                            passed the CBD stone. Recheck LFTs in AM. Procedure Code(s):        --- Professional ---                           [phone], Esophagogastroduodenoscopy, flexible,                            transoral; with transendoscopic balloon dilation of                            esophagus (less than 30 mm diameter)                           [id number], 59, Esophagogastroduodenoscopy, flexible,                            transoral; with biopsy, single or multiple Diagnosis Code(s):        --- Professional ---                           K22.2, Esophageal obstruction                           K44.9, Diaphragmatic hernia without obstruction or                            gangrene                           K31.7, Polyp of stomach and duodenum                            K29.80, Duodenitis without bleeding                           R13.10, Dysphagia, unspecified CPT copyright 2019 [employer]. All rights reserved. The codes documented in this report are preliminary and upon coder review may  be revised to meet current compliance requirements. Jackquline Denmark, MD 04/06/2022 2:18:52 PM This report has been signed electronically. Number of Addenda: 0

## 2022-04-06 NOTE — Progress Notes (Addendum)
Initial Nutrition Assessment  DOCUMENTATION CODES:   Not applicable  INTERVENTION:   When diet advanced after surgery, start: Ensure Enlive po TID, each supplement provides 350 kcal and 20 grams of protein.  MVI with minerals daily.  NUTRITION DIAGNOSIS:   Increased nutrient needs related to post-op healing, other (see comment) (recent weight loss, suspected malnutrition) as evidenced by estimated needs.  GOAL:   Patient will meet greater than or equal to 90% of their needs  MONITOR:   Diet advancement, PO intake, Supplement acceptance, Labs  REASON FOR ASSESSMENT:   Malnutrition Screening Tool    ASSESSMENT:   81 yo male admitted with choledocholithiasis. PMH includes HTN, HLD, GERD, neurogenic bladder, BPH, esophageal dilation.  Patient out of room for surgery today (EGD with balloon dilation of the esophagus). Spoke with wife at bedside. Patient's wife reports usual intake at home: 3 meals and 1-2 snacks per day. Intake at home has been poor with decreased appetite and dysphagia for several months. Over the past few days, he has had severe abdominal pain. Per Op note, CBD stone appears to have passed. He has lost a lot of weight and she can see his ribs sticking out. Weight history reviewed. Patient with 14% weight loss over the past 5 months, which is severe. He drinks Ensure supplements at home at times, but not regularly.   Labs reviewed. Na 131 (up from 129 on 8/3) Medications reviewed and include Protonix, Miralax, Senokot.  NUTRITION - FOCUSED PHYSICAL EXAM:  Unable to complete, patient out of room for surgery  Diet Order:   Diet Order             DIET SOFT Room service appropriate? Yes; Fluid consistency: Thin  Diet effective now                   EDUCATION NEEDS:   Not appropriate for education at this time  Skin:  Skin Assessment: Reviewed RN Assessment  Last BM:  8/4 type 2  Height:   Ht Readings from Last 1 Encounters:  04/05/22 '5\' 8"'$   (1.727 m)    Weight:   Wt Readings from Last 1 Encounters:  04/05/22 58.6 kg    Ideal Body Weight:  70 kg  BMI:  Body mass index is 19.64 kg/m.  Estimated Nutritional Needs:   Kcal:  1800-2000  Protein:  85-95 gm  Fluid:  1.8-2 L    Lucas Mallow RD, LDN, CNSC Please refer to Amion for contact information.

## 2022-04-06 NOTE — Anesthesia Preprocedure Evaluation (Addendum)
Anesthesia Evaluation  Patient identified by MRN, date of birth, ID band Patient awake    Reviewed: Allergy & Precautions, NPO status , Patient's Chart, lab work & pertinent test results  Airway Mallampati: II  TM Distance: >3 FB Neck ROM: Full    Dental no notable dental hx. (+) Teeth Intact, Dental Advisory Given   Pulmonary former smoker,    Pulmonary exam normal breath sounds clear to auscultation       Cardiovascular hypertension, Normal cardiovascular exam Rhythm:Regular Rate:Normal     Neuro/Psych    GI/Hepatic GERD  ,  Endo/Other    Renal/GU      Musculoskeletal  (+) Arthritis ,   Abdominal   Peds  Hematology Lab Results      Component                Value               Date                      WBC                      6.8                 04/06/2022                HGB                      12.1 (L)            04/06/2022                HCT                      33.3 (L)            04/06/2022                MCV                      89.0                04/06/2022                PLT                      261                 04/06/2022              Anesthesia Other Findings All: trazadone  Reproductive/Obstetrics                            Anesthesia Physical Anesthesia Plan  ASA: 3  Anesthesia Plan: MAC   Post-op Pain Management:    Induction: Intravenous  PONV Risk Score and Plan: Treatment may vary due to age or medical condition  Airway Management Planned: Natural Airway and Nasal Cannula  Additional Equipment: None  Intra-op Plan:   Post-operative Plan:   Informed Consent:     Dental advisory given  Plan Discussed with:   Anesthesia Plan Comments:         Anesthesia Quick Evaluation

## 2022-04-07 ENCOUNTER — Encounter (HOSPITAL_COMMUNITY): Payer: Self-pay | Admitting: Gastroenterology

## 2022-04-07 DIAGNOSIS — K449 Diaphragmatic hernia without obstruction or gangrene: Secondary | ICD-10-CM | POA: Diagnosis not present

## 2022-04-07 DIAGNOSIS — K8050 Calculus of bile duct without cholangitis or cholecystitis without obstruction: Secondary | ICD-10-CM | POA: Diagnosis not present

## 2022-04-07 DIAGNOSIS — R338 Other retention of urine: Secondary | ICD-10-CM | POA: Diagnosis not present

## 2022-04-07 DIAGNOSIS — K219 Gastro-esophageal reflux disease without esophagitis: Secondary | ICD-10-CM | POA: Diagnosis not present

## 2022-04-07 DIAGNOSIS — K5900 Constipation, unspecified: Secondary | ICD-10-CM | POA: Diagnosis not present

## 2022-04-07 LAB — COMPREHENSIVE METABOLIC PANEL
ALT: 30 U/L (ref 0–44)
AST: 26 U/L (ref 15–41)
Albumin: 3 g/dL — ABNORMAL LOW (ref 3.5–5.0)
Alkaline Phosphatase: 41 U/L (ref 38–126)
Anion gap: 6 (ref 5–15)
BUN: 11 mg/dL (ref 8–23)
CO2: 25 mmol/L (ref 22–32)
Calcium: 8.7 mg/dL — ABNORMAL LOW (ref 8.9–10.3)
Chloride: 101 mmol/L (ref 98–111)
Creatinine, Ser: 0.79 mg/dL (ref 0.61–1.24)
GFR, Estimated: >60 mL/min (ref >60)
Glucose, Bld: 99 mg/dL (ref 70–99)
Potassium: 3.7 mmol/L (ref 3.5–5.1)
Sodium: 132 mmol/L — ABNORMAL LOW (ref 135–145)
Total Bilirubin: 1.4 mg/dL — ABNORMAL HIGH (ref 0.3–1.2)
Total Protein: 5.2 g/dL — ABNORMAL LOW (ref 6.5–8.1)

## 2022-04-07 MED ORDER — SENNA 8.6 MG PO TABS: 1.0000 | ORAL_TABLET | Freq: Every day | ORAL | 0 refills | Status: DC

## 2022-04-07 MED ORDER — PANTOPRAZOLE SODIUM 40 MG PO TBEC
40.0000 mg | DELAYED_RELEASE_TABLET | Freq: Every day | ORAL | Status: DC
  Administered 2022-04-07: 40 mg via ORAL
  Filled 2022-04-07: qty 1

## 2022-04-07 MED ORDER — POLYETHYLENE GLYCOL 3350 17 G PO PACK: 17.0000 g | PACK | Freq: Two times a day (BID) | ORAL | 0 refills | Status: DC

## 2022-04-07 NOTE — Progress Notes (Signed)
Nsg Discharge Note  Admit Date:  04/05/2022 Discharge date: 04/07/2022   [place] [facility] to be D/C'd Home per MD order.  AVS completed. Patient/caregiver able to verbalize understanding.  Discharge Medication: Allergies as of 04/07/2022       Reactions   Nitroglycerin Anaphylaxis, Other (See Comments)   Dropped B/P and the patient PASSED OUT   Clindamycin/lincomycin Diarrhea, Other (See Comments)   Passed out, also   Doxycycline Other (See Comments)   Constipation   Trazodone And Nefazodone Nausea And Vomiting, Other (See Comments)   Vasovagal response and dizziness        Medication List     TAKE these medications    amLODipine 10 MG tablet Commonly known as: NORVASC Take 10 mg by mouth daily.   aspirin EC 81 MG tablet Take 81 mg by mouth daily.   dutasteride 0.5 MG capsule Commonly known as: AVODART Take 0.5 mg by mouth daily.   lisinopril 20 MG tablet Commonly known as: ZESTRIL Take 20 mg by mouth daily.   omeprazole 40 MG capsule Commonly known as: PRILOSEC Take 40 mg by mouth daily before breakfast.   polyethylene glycol 17 g packet Commonly known as: MIRALAX / GLYCOLAX Take 17 g by mouth 2 (two) times daily.   rosuvastatin 5 MG tablet Commonly known as: CRESTOR Take 5 mg by mouth daily.   senna 8.6 MG Tabs tablet Commonly known as: SENOKOT Take 1 tablet (8.6 mg total) by mouth at bedtime.   silodosin 8 MG Caps capsule Commonly known as: RAPAFLO Take 8 mg by mouth at bedtime.   Vitamin A 2400 MCG (8000 UT) Caps Take 2,400 mcg by mouth daily with breakfast.   Vitamin D3 25 MCG (1000 UT) Caps Take 1,000 Units by mouth daily with breakfast.        Discharge Assessment: Vitals:   04/06/22 2036 04/07/22 0755  BP: 133/78 (!) 153/74  Pulse: 65 90  Resp: 15 19  Temp: 97.9 F (36.6 C) 97.7 F (36.5 C)  SpO2: 100% 100%   Skin clean, dry and intact without evidence of skin break down, no evidence of skin tears noted. IV catheter discontinued  intact. Site without signs and symptoms of complications - no redness or edema noted at insertion site, patient denies c/o pain - only slight tenderness at site.  Dressing with slight pressure applied.  D/c Instructions-Education: Discharge instructions given to patient/family with verbalized understanding. D/c education completed with patient/family including follow up instructions, medication list, d/c activities limitations if indicated, with other d/c instructions as indicated by MD - patient able to verbalize understanding, all questions fully answered. Patient instructed to return to ED, call 911, or call MD for any changes in condition.  Patient escorted via [place], and D/C home via private auto.  Atilano Ina, RN 04/07/2022 10:33 AM

## 2022-04-07 NOTE — Discharge Summary (Addendum)
PATIENT DETAILS Name: Mason [facility] Age: 81 y.o. Sex: male Date of Birth: 25-Sep-1940 MRN: [id number]. Admitting Physician: Mason Osgood, MD [facility], Mason Penner, NP  Admit Date: 04/05/2022 Discharge date: 04/07/2022  Recommendations for Outpatient Follow-up:  Follow up with PCP in 1-2 weeks Please obtain CMP/CBC in one week As lung nodules-needs repeat CT imaging in 6 to 12 weeks-we will defer to patient's PCP.  Admitted From:  Home  Disposition: Home   Discharge Condition: good  CODE STATUS:   Code Status: Full Code   Diet recommendation:  Diet Order             Diet - low sodium heart healthy           DIET SOFT Room service appropriate? Yes; Fluid consistency: Thin  Diet effective now                    Brief Summary: Patient is a 81 y.o.  male with history of HTN, GERD, neurogenic bladder, BPH-who presented to [facility] on 8/3 with worsening epigastric pain-he was found to have choledocholithiasis and transferred to [facility] for [facility] evaluation.     Significant events: 8/3>> transfer from [facility] to [facility] for evaluation of choledocholithiasis.   Significant studies: 8/3>> CT abdomen: Calcified stone in the distal CBD, large stool burden, circumferential mid/distal thickening of esophagus.  Multiple pulmonary nodules.  Most severe 4 mm left solid nodule. 8/3>> MRCP: S/p cholecystectomy-mild intra-/extrahepatic biliary ductal dilatation, small calculi in the distal CBD identified by prior CT is not clearly appreciated.  Large stool burden.   Significant microbiology data: None   Procedures: 8/04>> EGD: Benign-appearing esophageal stenosis with surrounding esophagitis-dilated.  Multiple gastric polyps, duodenitis.   Consults: [facility]  Brief [facility] Course: Epigastric pain: Likely due to esophagitis/duodenitis-s/p EGD on 8/4-recommendations are to continue omeprazole as an outpatient.  Patient will follow with [facility] clinic in 4 to 6 weeks for repeat dilatation.    Choledocholithiasis: ?  Passed-seen initially on CT scan-not seen on subsequent MRCP-discussed with [facility]-Dr. Victorino [facility] need to pursue ERCP as his LFTs/symptoms are better.  If he were to have symptoms in the future-further evaluation can be considered.    Hyponatremia: Improved with just supportive care.  Received IVF at [facility].  Since improved with IVF-likely secondary to dehydration-although he was euvolemic on exam when he arrived at [facility].   Constipation: Significant stool burden seen on imaging studies-and with MiraLAX/senna.   HTN: BP creeping up-continue amlodipine-resume lisinopril on discharge.    Lung nodules (most severe 4 mm left lung nodule): Seen incidentally on CT imaging-we will need repeat imaging in 6 to 12 months at the discretion of PCP   GERD: Continue PPI   Acute urinary retention: Found to have distended bladder at [facility]-Foley placed in the [facility]. Will remove Foley catheter today-and continue self-catheterization at home.  He has a history of neurogenic bladder.     BPH: Continue dutasteride.   BMI: Estimated body mass index is 19.64 kg/m as calculated from the following:   Height as of this encounter: '5\' 8"'$  (1.727 m).   Weight as of this encounter: 58.6 kg.    Nutrition Status: Nutrition Problem: Increased nutrient needs Etiology: post-op healing, other (see comment) (recent weight loss, suspected malnutrition) Signs/Symptoms: estimated needs Interventions: Ensure Enlive (each supplement provides 350kcal and 20 grams of protein), MVI  Discharge Diagnoses:  Principal Problem:   Choledocholithiasis Active Problems:   HTN (hypertension)   Neurogenic bladder  Acute urinary retention   Hyponatremia   GERD (gastroesophageal reflux disease)   Lung nodule   Constipation   Discharge Instructions:  Activity:  As tolerated   Discharge Instructions     Call MD for:  persistant nausea and vomiting   Complete by: As directed    Call MD for:   severe uncontrolled pain   Complete by: As directed    Diet - low sodium heart healthy   Complete by: As directed    Discharge instructions   Complete by: As directed    Follow with Primary MD  Mason Shutter, NP in 1-2 weeks  Follow-up with [facility]-the office will call you for a follow-up appointment.  Incidental finding-you had lung nodule seen on CT abdomen that you had done at [facility]-please ask your primary care practitioner to repeat CT imaging in 6 to 12 months-in some cases these nodules can turn cancerous.  It is important that you get periodic surveillance at the discretion of your primary care practitioner.  Please get a complete blood count and chemistry panel checked by your Primary MD at your next visit, and again as instructed by your Primary MD.  Get Medicines reviewed and adjusted: Please take all your medications with you for your next visit with your Primary MD  Laboratory/radiological data: Please request your Primary MD to go over all [facility] tests and procedure/radiological results at the follow up, please ask your Primary MD to get all Stark records sent to his/her office.  In some cases, they will be blood work, cultures and biopsy results pending at the time of your discharge. Please request that your primary care M.D. follows up on these results.  Also Note the following: If you experience worsening of your admission symptoms, develop shortness of breath, life threatening emergency, suicidal or homicidal thoughts you must seek medical attention immediately by calling 911 or calling your MD immediately  if symptoms less severe.  You must read complete instructions/literature along with all the possible adverse reactions/side effects for all the Medicines you take and that have been prescribed to you. Take any new Medicines after you have completely understood and accpet all the possible adverse reactions/side effects.   Do not drive when taking  Pain medications or sleeping medications (Benzodaizepines)  Do not take more than prescribed Pain, Sleep and Anxiety Medications. It is not advisable to combine anxiety,sleep and pain medications without talking with your primary care practitioner  Special Instructions: If you have smoked or chewed Tobacco  in the last 2 yrs please stop smoking, stop any regular Alcohol  and or any Recreational drug use.  Wear Seat belts while driving.  Please note: You were cared for by a hospitalist during your [facility] stay. Once you are discharged, your primary care physician will handle any further medical issues. Please note that NO REFILLS for any discharge medications will be authorized once you are discharged, as it is imperative that you return to your primary care physician (or establish a relationship with a primary care physician if you do not have one) for your post [facility] discharge needs so that they can reassess your need for medications and monitor your lab values.   Increase activity slowly   Complete by: As directed       Allergies as of 04/07/2022       Reactions   Nitroglycerin Anaphylaxis, Other (See Comments)   Dropped B/P and the patient PASSED OUT   Clindamycin/lincomycin Diarrhea, Other (See Comments)  Passed out, also   Doxycycline Other (See Comments)   Constipation   Trazodone And Nefazodone Nausea And Vomiting, Other (See Comments)   Vasovagal response and dizziness        Medication List     TAKE these medications    amLODipine 10 MG tablet Commonly known as: NORVASC Take 10 mg by mouth daily.   aspirin EC 81 MG tablet Take 81 mg by mouth daily.   dutasteride 0.5 MG capsule Commonly known as: AVODART Take 0.5 mg by mouth daily.   lisinopril 20 MG tablet Commonly known as: ZESTRIL Take 20 mg by mouth daily.   omeprazole 40 MG capsule Commonly known as: PRILOSEC Take 40 mg by mouth daily before breakfast.   polyethylene glycol 17 g packet Commonly  known as: MIRALAX / GLYCOLAX Take 17 g by mouth 2 (two) times daily.   rosuvastatin 5 MG tablet Commonly known as: CRESTOR Take 5 mg by mouth daily.   senna 8.6 MG Tabs tablet Commonly known as: SENOKOT Take 1 tablet (8.6 mg total) by mouth at bedtime.   silodosin 8 MG Caps capsule Commonly known as: RAPAFLO Take 8 mg by mouth at bedtime.   Vitamin A 2400 MCG (8000 UT) Caps Take 2,400 mcg by mouth daily with breakfast.   Vitamin D3 25 MCG (1000 UT) Caps Take 1,000 Units by mouth daily with breakfast.        Follow-up Information     Mason Shutter, NP. Schedule an appointment as soon as possible for a visit in 1 week(s).   Contact information: [place] [address] [phone]         Jackquline Denmark, MD Follow up.   Specialties: Gastroenterology, Internal Medicine Why: Office will call with date/time, If you dont hear from them,please give them a call Contact information: [address] [place] [address] [phone]                Allergies  Allergen Reactions   Nitroglycerin Anaphylaxis and Other (See Comments)    Dropped B/P and the patient PASSED OUT   Clindamycin/Lincomycin Diarrhea and Other (See Comments)    Passed out, also   Doxycycline Other (See Comments)    Constipation    Trazodone And Nefazodone Nausea And Vomiting and Other (See Comments)    Vasovagal response and dizziness     Other Procedures/Studies: No results found.   TODAY-DAY OF DISCHARGE:  Subjective:   [facility] today has no headache,no chest abdominal pain,no new weakness tingling or numbness, feels much better wants to go home today.   Objective:   Blood pressure (!) 153/74, pulse 90, temperature 97.7 F (36.5 C), temperature source Oral, resp. rate 19, height '5\' 8"'$  (1.727 m), weight 58.6 kg, SpO2 100 %.  Intake/Output Summary (Last 24 hours) at 04/07/2022 0845 Last data filed at 04/07/2022 0810 Gross per 24 hour   Intake 480 ml  Output 2300 ml  Net -1820 ml   Filed Weights   04/05/22 1702  Weight: 58.6 kg    Exam: Awake Alert, Oriented *3, No new F.N deficits, Normal affect [place].AT,PERRAL Supple Neck,No JVD, No cervical lymphadenopathy appriciated.  Symmetrical Chest wall movement, Good air movement bilaterally, CTAB RRR,No Gallops,Rubs or new Murmurs, No Parasternal Heave +ve B.Sounds, Abd Soft, Non tender, No organomegaly appriciated, No rebound -guarding or rigidity. No Cyanosis, Clubbing or edema, No new Rash or bruise   PERTINENT RADIOLOGIC STUDIES: No results found.   PERTINENT  LAB RESULTS: CBC: Recent Labs    04/05/22 1801 04/06/22 0701  WBC 10.9* 6.8  HGB 13.2 12.1*  HCT 35.5* 33.3*  PLT 276 261   CMET CMP     Component Value Date/Time   NA 132 (L) 04/07/2022 0324   K 3.7 04/07/2022 0324   CL 101 04/07/2022 0324   CO2 25 04/07/2022 0324   GLUCOSE 99 04/07/2022 0324   BUN 11 04/07/2022 0324   CREATININE 0.79 04/07/2022 0324   CALCIUM 8.7 (L) 04/07/2022 0324   PROT 5.2 (L) 04/07/2022 0324   ALBUMIN 3.0 (L) 04/07/2022 0324   AST 26 04/07/2022 0324   ALT 30 04/07/2022 0324   ALKPHOS 41 04/07/2022 0324   BILITOT 1.4 (H) 04/07/2022 0324   GFRNONAA >60 04/07/2022 0324   GFRAA >60 12/22/2017 0759    GFR Estimated Creatinine Clearance: 62.1 mL/min (by C-G formula based on SCr of 0.79 mg/dL). Recent Labs    04/06/22 0701  LIPASE 33   No results for input(s): "CKTOTAL", "CKMB", "CKMBINDEX", "TROPONINI" in the last 72 hours. Invalid input(s): "POCBNP" No results for input(s): "DDIMER" in the last 72 hours. No results for input(s): "HGBA1C" in the last 72 hours. No results for input(s): "CHOL", "HDL", "LDLCALC", "TRIG", "CHOLHDL", "LDLDIRECT" in the last 72 hours. Recent Labs    04/05/22 1801  TSH 1.270   No results for input(s): "VITAMINB12", "FOLATE", "FERRITIN", "TIBC", "IRON", "RETICCTPCT" in the last 72 hours. Coags: Recent Labs    04/05/22 1801   INR 1.0   Microbiology: Recent Results (from the past 240 hour(s))  Surgical PCR screen     Status: None   Collection Time: 04/05/22 10:14 PM   Specimen: Nasal Mucosa; Nasal Swab  Result Value Ref Range Status   MRSA, PCR NEGATIVE NEGATIVE Final   Staphylococcus aureus NEGATIVE NEGATIVE Final    Comment: (NOTE) The Xpert SA Assay (FDA approved for NASAL specimens in patients 49 years of age and older), is one component of a comprehensive surveillance program. It is not intended to diagnose infection nor to guide or monitor treatment. Performed at [facility], [place] [address], [place], [place] [address]     FURTHER DISCHARGE INSTRUCTIONS:  Get Medicines reviewed and adjusted: Please take all your medications with you for your next visit with your Primary MD  Laboratory/radiological data: Please request your Primary MD to go over all [facility] tests and procedure/radiological results at the follow up, please ask your Primary MD to get all [facility] records sent to his/her office.  In some cases, they will be blood work, cultures and biopsy results pending at the time of your discharge. Please request that your primary care M.D. goes through all the records of your [facility] data and follows up on these results.  Also Note the following: If you experience worsening of your admission symptoms, develop shortness of breath, life threatening emergency, suicidal or homicidal thoughts you must seek medical attention immediately by calling 911 or calling your MD immediately  if symptoms less severe.  You must read complete instructions/literature along with all the possible adverse reactions/side effects for all the Medicines you take and that have been prescribed to you. Take any new Medicines after you have completely understood and accpet all the possible adverse reactions/side effects.   Do not drive when taking Pain medications or sleeping medications (Benzodaizepines)  Do not  take more than prescribed Pain, Sleep and Anxiety Medications. It is not advisable to combine anxiety,sleep and pain medications without talking  with your primary care practitioner  Special Instructions: If you have smoked or chewed Tobacco  in the last 2 yrs please stop smoking, stop any regular Alcohol  and or any Recreational drug use.  Wear Seat belts while driving.  Please note: You were cared for by a hospitalist during your [facility] stay. Once you are discharged, your primary care physician will handle any further medical issues. Please note that NO REFILLS for any discharge medications will be authorized once you are discharged, as it is imperative that you return to your primary care physician (or establish a relationship with a primary care physician if you do not have one) for your post [facility] discharge needs so that they can reassess your need for medications and monitor your lab values.  Total Time spent coordinating discharge including counseling, education and face to face time equals greater than 30 minutes.  SignedOren Binet 04/07/2022 8:45 AM

## 2022-04-11 LAB — SURGICAL PATHOLOGY

## 2022-04-27 ENCOUNTER — Encounter (HOSPITAL_BASED_OUTPATIENT_CLINIC_OR_DEPARTMENT_OTHER): Payer: Self-pay

## 2022-04-27 ENCOUNTER — Ambulatory Visit (HOSPITAL_BASED_OUTPATIENT_CLINIC_OR_DEPARTMENT_OTHER): Admit: 2022-04-27 | Payer: Medicare PPO | Admitting: Urology

## 2022-04-27 SURGERY — THULIUM LASER TURP (TRANSURETHRAL RESECTION OF PROSTATE)
Anesthesia: General

## 2022-09-19 DIAGNOSIS — F0390 Unspecified dementia without behavioral disturbance: Secondary | ICD-10-CM | POA: Diagnosis not present

## 2022-09-19 DIAGNOSIS — E559 Vitamin D deficiency, unspecified: Secondary | ICD-10-CM | POA: Diagnosis not present

## 2022-09-24 DIAGNOSIS — R110 Nausea: Secondary | ICD-10-CM | POA: Diagnosis not present

## 2022-09-24 DIAGNOSIS — F0390 Unspecified dementia without behavioral disturbance: Secondary | ICD-10-CM | POA: Diagnosis not present

## 2022-09-24 DIAGNOSIS — K5900 Constipation, unspecified: Secondary | ICD-10-CM | POA: Diagnosis not present

## 2022-09-24 DIAGNOSIS — F419 Anxiety disorder, unspecified: Secondary | ICD-10-CM | POA: Diagnosis not present

## 2022-09-26 DIAGNOSIS — F0390 Unspecified dementia without behavioral disturbance: Secondary | ICD-10-CM | POA: Diagnosis not present

## 2022-09-26 DIAGNOSIS — F419 Anxiety disorder, unspecified: Secondary | ICD-10-CM | POA: Diagnosis not present

## 2022-09-26 DIAGNOSIS — K5900 Constipation, unspecified: Secondary | ICD-10-CM | POA: Diagnosis not present

## 2022-10-01 DIAGNOSIS — F0390 Unspecified dementia without behavioral disturbance: Secondary | ICD-10-CM | POA: Diagnosis not present

## 2022-10-01 DIAGNOSIS — G4700 Insomnia, unspecified: Secondary | ICD-10-CM | POA: Diagnosis not present

## 2022-10-01 DIAGNOSIS — F419 Anxiety disorder, unspecified: Secondary | ICD-10-CM | POA: Diagnosis not present

## 2022-10-29 DIAGNOSIS — J449 Chronic obstructive pulmonary disease, unspecified: Secondary | ICD-10-CM | POA: Diagnosis not present

## 2022-10-29 DIAGNOSIS — F0390 Unspecified dementia without behavioral disturbance: Secondary | ICD-10-CM

## 2022-10-29 DIAGNOSIS — R5381 Other malaise: Secondary | ICD-10-CM | POA: Diagnosis not present

## 2022-10-29 DIAGNOSIS — R059 Cough, unspecified: Secondary | ICD-10-CM | POA: Diagnosis not present

## 2022-10-29 DIAGNOSIS — R4182 Altered mental status, unspecified: Secondary | ICD-10-CM | POA: Diagnosis not present

## 2022-11-02 DIAGNOSIS — F0390 Unspecified dementia without behavioral disturbance: Secondary | ICD-10-CM | POA: Diagnosis not present

## 2022-11-02 DIAGNOSIS — N390 Urinary tract infection, site not specified: Secondary | ICD-10-CM | POA: Diagnosis not present

## 2022-11-02 DIAGNOSIS — Z7401 Bed confinement status: Secondary | ICD-10-CM | POA: Diagnosis not present

## 2022-11-02 DIAGNOSIS — J449 Chronic obstructive pulmonary disease, unspecified: Secondary | ICD-10-CM | POA: Diagnosis not present

## 2022-11-02 DIAGNOSIS — G9341 Metabolic encephalopathy: Secondary | ICD-10-CM | POA: Diagnosis not present

## 2022-11-02 DIAGNOSIS — R279 Unspecified lack of coordination: Secondary | ICD-10-CM | POA: Diagnosis not present

## 2022-11-02 DIAGNOSIS — J189 Pneumonia, unspecified organism: Secondary | ICD-10-CM | POA: Diagnosis not present

## 2022-11-02 DIAGNOSIS — U071 COVID-19: Secondary | ICD-10-CM | POA: Diagnosis not present

## 2022-11-02 DIAGNOSIS — R5381 Other malaise: Secondary | ICD-10-CM | POA: Diagnosis not present

## 2022-11-02 DIAGNOSIS — A419 Sepsis, unspecified organism: Secondary | ICD-10-CM | POA: Diagnosis not present

## 2022-11-02 DIAGNOSIS — R269 Unspecified abnormalities of gait and mobility: Secondary | ICD-10-CM | POA: Diagnosis not present

## 2022-11-02 DIAGNOSIS — G9340 Encephalopathy, unspecified: Secondary | ICD-10-CM | POA: Diagnosis not present

## 2022-11-02 DIAGNOSIS — M6281 Muscle weakness (generalized): Secondary | ICD-10-CM | POA: Diagnosis not present

## 2022-11-12 DIAGNOSIS — F0390 Unspecified dementia without behavioral disturbance: Secondary | ICD-10-CM | POA: Diagnosis not present

## 2022-11-12 DIAGNOSIS — R1310 Dysphagia, unspecified: Secondary | ICD-10-CM | POA: Diagnosis not present

## 2022-11-12 DIAGNOSIS — F419 Anxiety disorder, unspecified: Secondary | ICD-10-CM | POA: Diagnosis not present

## 2022-11-21 DIAGNOSIS — L22 Diaper dermatitis: Secondary | ICD-10-CM | POA: Diagnosis not present

## 2022-11-21 DIAGNOSIS — R1084 Generalized abdominal pain: Secondary | ICD-10-CM | POA: Diagnosis not present

## 2022-11-21 DIAGNOSIS — R109 Unspecified abdominal pain: Secondary | ICD-10-CM | POA: Diagnosis not present

## 2022-11-21 DIAGNOSIS — F0390 Unspecified dementia without behavioral disturbance: Secondary | ICD-10-CM | POA: Diagnosis not present

## 2022-11-21 DIAGNOSIS — K5900 Constipation, unspecified: Secondary | ICD-10-CM | POA: Diagnosis not present

## 2022-11-21 DIAGNOSIS — R195 Other fecal abnormalities: Secondary | ICD-10-CM | POA: Diagnosis not present

## 2022-11-21 DIAGNOSIS — K649 Unspecified hemorrhoids: Secondary | ICD-10-CM | POA: Diagnosis not present

## 2022-12-18 DIAGNOSIS — F411 Generalized anxiety disorder: Secondary | ICD-10-CM | POA: Diagnosis not present

## 2022-12-18 DIAGNOSIS — F5105 Insomnia due to other mental disorder: Secondary | ICD-10-CM | POA: Diagnosis not present

## 2022-12-18 DIAGNOSIS — F03918 Unspecified dementia, unspecified severity, with other behavioral disturbance: Secondary | ICD-10-CM | POA: Diagnosis not present

## 2022-12-28 DIAGNOSIS — F0390 Unspecified dementia without behavioral disturbance: Secondary | ICD-10-CM | POA: Diagnosis not present

## 2022-12-28 DIAGNOSIS — K5900 Constipation, unspecified: Secondary | ICD-10-CM | POA: Diagnosis not present

## 2022-12-28 DIAGNOSIS — R1310 Dysphagia, unspecified: Secondary | ICD-10-CM | POA: Diagnosis not present

## 2023-01-09 DIAGNOSIS — R1310 Dysphagia, unspecified: Secondary | ICD-10-CM | POA: Diagnosis not present

## 2023-01-09 DIAGNOSIS — F0390 Unspecified dementia without behavioral disturbance: Secondary | ICD-10-CM | POA: Diagnosis not present

## 2023-01-09 DIAGNOSIS — R635 Abnormal weight gain: Secondary | ICD-10-CM | POA: Diagnosis not present

## 2023-01-25 DIAGNOSIS — I119 Hypertensive heart disease without heart failure: Secondary | ICD-10-CM | POA: Diagnosis not present

## 2023-01-25 DIAGNOSIS — F0390 Unspecified dementia without behavioral disturbance: Secondary | ICD-10-CM | POA: Diagnosis not present

## 2023-02-08 DIAGNOSIS — I119 Hypertensive heart disease without heart failure: Secondary | ICD-10-CM | POA: Diagnosis not present

## 2023-02-08 DIAGNOSIS — R600 Localized edema: Secondary | ICD-10-CM | POA: Diagnosis not present

## 2023-02-08 DIAGNOSIS — F0390 Unspecified dementia without behavioral disturbance: Secondary | ICD-10-CM | POA: Diagnosis not present

## 2023-02-13 DIAGNOSIS — D649 Anemia, unspecified: Secondary | ICD-10-CM | POA: Diagnosis not present

## 2023-02-13 DIAGNOSIS — J449 Chronic obstructive pulmonary disease, unspecified: Secondary | ICD-10-CM | POA: Diagnosis not present

## 2023-02-13 DIAGNOSIS — I119 Hypertensive heart disease without heart failure: Secondary | ICD-10-CM | POA: Diagnosis not present

## 2023-02-13 DIAGNOSIS — I4891 Unspecified atrial fibrillation: Secondary | ICD-10-CM | POA: Diagnosis not present

## 2023-02-13 DIAGNOSIS — F0390 Unspecified dementia without behavioral disturbance: Secondary | ICD-10-CM | POA: Diagnosis not present

## 2023-02-13 DIAGNOSIS — K219 Gastro-esophageal reflux disease without esophagitis: Secondary | ICD-10-CM | POA: Diagnosis not present

## 2023-02-14 DIAGNOSIS — E119 Type 2 diabetes mellitus without complications: Secondary | ICD-10-CM | POA: Diagnosis not present

## 2023-02-14 DIAGNOSIS — I10 Essential (primary) hypertension: Secondary | ICD-10-CM | POA: Diagnosis not present

## 2023-02-14 DIAGNOSIS — D649 Anemia, unspecified: Secondary | ICD-10-CM | POA: Diagnosis not present

## 2023-02-19 DIAGNOSIS — F03918 Unspecified dementia, unspecified severity, with other behavioral disturbance: Secondary | ICD-10-CM | POA: Diagnosis not present

## 2023-02-19 DIAGNOSIS — F411 Generalized anxiety disorder: Secondary | ICD-10-CM | POA: Diagnosis not present

## 2023-02-19 DIAGNOSIS — F5105 Insomnia due to other mental disorder: Secondary | ICD-10-CM | POA: Diagnosis not present

## 2023-02-20 DIAGNOSIS — F0390 Unspecified dementia without behavioral disturbance: Secondary | ICD-10-CM | POA: Diagnosis not present

## 2023-02-20 DIAGNOSIS — D649 Anemia, unspecified: Secondary | ICD-10-CM | POA: Diagnosis not present

## 2023-02-20 DIAGNOSIS — R799 Abnormal finding of blood chemistry, unspecified: Secondary | ICD-10-CM | POA: Diagnosis not present

## 2023-02-20 DIAGNOSIS — E1169 Type 2 diabetes mellitus with other specified complication: Secondary | ICD-10-CM | POA: Diagnosis not present

## 2023-03-05 DIAGNOSIS — R1311 Dysphagia, oral phase: Secondary | ICD-10-CM | POA: Diagnosis not present

## 2023-03-05 DIAGNOSIS — F0280 Dementia in other diseases classified elsewhere without behavioral disturbance: Secondary | ICD-10-CM | POA: Diagnosis not present

## 2023-03-13 DIAGNOSIS — F0280 Dementia in other diseases classified elsewhere without behavioral disturbance: Secondary | ICD-10-CM | POA: Diagnosis not present

## 2023-03-13 DIAGNOSIS — R1311 Dysphagia, oral phase: Secondary | ICD-10-CM | POA: Diagnosis not present

## 2023-03-15 DIAGNOSIS — F0280 Dementia in other diseases classified elsewhere without behavioral disturbance: Secondary | ICD-10-CM | POA: Diagnosis not present

## 2023-03-15 DIAGNOSIS — R1311 Dysphagia, oral phase: Secondary | ICD-10-CM | POA: Diagnosis not present

## 2023-03-19 DIAGNOSIS — F03918 Unspecified dementia, unspecified severity, with other behavioral disturbance: Secondary | ICD-10-CM | POA: Diagnosis not present

## 2023-03-19 DIAGNOSIS — F411 Generalized anxiety disorder: Secondary | ICD-10-CM | POA: Diagnosis not present

## 2023-03-19 DIAGNOSIS — F5105 Insomnia due to other mental disorder: Secondary | ICD-10-CM | POA: Diagnosis not present

## 2023-03-19 DIAGNOSIS — R1311 Dysphagia, oral phase: Secondary | ICD-10-CM | POA: Diagnosis not present

## 2023-03-19 DIAGNOSIS — F0280 Dementia in other diseases classified elsewhere without behavioral disturbance: Secondary | ICD-10-CM | POA: Diagnosis not present

## 2023-03-20 DIAGNOSIS — F0280 Dementia in other diseases classified elsewhere without behavioral disturbance: Secondary | ICD-10-CM | POA: Diagnosis not present

## 2023-03-20 DIAGNOSIS — R1311 Dysphagia, oral phase: Secondary | ICD-10-CM | POA: Diagnosis not present

## 2023-03-22 DIAGNOSIS — G252 Other specified forms of tremor: Secondary | ICD-10-CM | POA: Diagnosis not present

## 2023-03-22 DIAGNOSIS — R413 Other amnesia: Secondary | ICD-10-CM | POA: Diagnosis not present

## 2023-03-26 DIAGNOSIS — F0280 Dementia in other diseases classified elsewhere without behavioral disturbance: Secondary | ICD-10-CM | POA: Diagnosis not present

## 2023-03-26 DIAGNOSIS — R1311 Dysphagia, oral phase: Secondary | ICD-10-CM | POA: Diagnosis not present

## 2023-03-27 DIAGNOSIS — H1131 Conjunctival hemorrhage, right eye: Secondary | ICD-10-CM | POA: Diagnosis not present

## 2023-03-27 DIAGNOSIS — H35372 Puckering of macula, left eye: Secondary | ICD-10-CM | POA: Diagnosis not present

## 2023-03-27 DIAGNOSIS — H33001 Unspecified retinal detachment with retinal break, right eye: Secondary | ICD-10-CM | POA: Diagnosis not present

## 2023-03-27 DIAGNOSIS — H43812 Vitreous degeneration, left eye: Secondary | ICD-10-CM | POA: Diagnosis not present

## 2023-03-27 DIAGNOSIS — C6931 Malignant neoplasm of right choroid: Secondary | ICD-10-CM | POA: Diagnosis not present

## 2023-04-02 DIAGNOSIS — C6931 Malignant neoplasm of right choroid: Secondary | ICD-10-CM | POA: Diagnosis not present

## 2023-04-02 DIAGNOSIS — H35372 Puckering of macula, left eye: Secondary | ICD-10-CM | POA: Diagnosis not present

## 2023-04-02 DIAGNOSIS — Z961 Presence of intraocular lens: Secondary | ICD-10-CM | POA: Diagnosis not present

## 2023-04-02 DIAGNOSIS — H43812 Vitreous degeneration, left eye: Secondary | ICD-10-CM | POA: Diagnosis not present

## 2023-04-04 DIAGNOSIS — F0280 Dementia in other diseases classified elsewhere without behavioral disturbance: Secondary | ICD-10-CM | POA: Diagnosis not present

## 2023-04-04 DIAGNOSIS — R1311 Dysphagia, oral phase: Secondary | ICD-10-CM | POA: Diagnosis not present

## 2023-04-05 DIAGNOSIS — C6931 Malignant neoplasm of right choroid: Secondary | ICD-10-CM | POA: Diagnosis not present

## 2023-04-10 DIAGNOSIS — F0280 Dementia in other diseases classified elsewhere without behavioral disturbance: Secondary | ICD-10-CM | POA: Diagnosis not present

## 2023-04-10 DIAGNOSIS — R1311 Dysphagia, oral phase: Secondary | ICD-10-CM | POA: Diagnosis not present

## 2023-04-15 DIAGNOSIS — N3289 Other specified disorders of bladder: Secondary | ICD-10-CM | POA: Diagnosis not present

## 2023-04-15 DIAGNOSIS — C439 Malignant melanoma of skin, unspecified: Secondary | ICD-10-CM | POA: Diagnosis not present

## 2023-04-15 DIAGNOSIS — C6931 Malignant neoplasm of right choroid: Secondary | ICD-10-CM | POA: Diagnosis not present

## 2023-04-15 DIAGNOSIS — I700 Atherosclerosis of aorta: Secondary | ICD-10-CM | POA: Diagnosis not present

## 2023-04-15 DIAGNOSIS — C6991 Malignant neoplasm of unspecified site of right eye: Secondary | ICD-10-CM | POA: Diagnosis not present

## 2023-04-23 DIAGNOSIS — F5105 Insomnia due to other mental disorder: Secondary | ICD-10-CM | POA: Diagnosis not present

## 2023-04-23 DIAGNOSIS — F411 Generalized anxiety disorder: Secondary | ICD-10-CM | POA: Diagnosis not present

## 2023-04-23 DIAGNOSIS — F03918 Unspecified dementia, unspecified severity, with other behavioral disturbance: Secondary | ICD-10-CM | POA: Diagnosis not present

## 2023-04-26 DIAGNOSIS — R1311 Dysphagia, oral phase: Secondary | ICD-10-CM | POA: Diagnosis not present

## 2023-04-26 DIAGNOSIS — F0280 Dementia in other diseases classified elsewhere without behavioral disturbance: Secondary | ICD-10-CM | POA: Diagnosis not present

## 2023-04-29 DIAGNOSIS — F0280 Dementia in other diseases classified elsewhere without behavioral disturbance: Secondary | ICD-10-CM | POA: Diagnosis not present

## 2023-04-29 DIAGNOSIS — Z79899 Other long term (current) drug therapy: Secondary | ICD-10-CM | POA: Diagnosis not present

## 2023-04-29 DIAGNOSIS — Z881 Allergy status to other antibiotic agents status: Secondary | ICD-10-CM | POA: Diagnosis not present

## 2023-04-29 DIAGNOSIS — C6931 Malignant neoplasm of right choroid: Secondary | ICD-10-CM | POA: Diagnosis not present

## 2023-04-29 DIAGNOSIS — Z888 Allergy status to other drugs, medicaments and biological substances status: Secondary | ICD-10-CM | POA: Diagnosis not present

## 2023-04-29 DIAGNOSIS — I10 Essential (primary) hypertension: Secondary | ICD-10-CM | POA: Diagnosis not present

## 2023-04-29 DIAGNOSIS — H3530 Unspecified macular degeneration: Secondary | ICD-10-CM | POA: Diagnosis not present

## 2023-04-29 DIAGNOSIS — Z7982 Long term (current) use of aspirin: Secondary | ICD-10-CM | POA: Diagnosis not present

## 2023-04-29 DIAGNOSIS — R1311 Dysphagia, oral phase: Secondary | ICD-10-CM | POA: Diagnosis not present

## 2023-05-02 DIAGNOSIS — R1311 Dysphagia, oral phase: Secondary | ICD-10-CM | POA: Diagnosis not present

## 2023-05-02 DIAGNOSIS — F0280 Dementia in other diseases classified elsewhere without behavioral disturbance: Secondary | ICD-10-CM | POA: Diagnosis not present

## 2023-05-05 DIAGNOSIS — N390 Urinary tract infection, site not specified: Secondary | ICD-10-CM | POA: Diagnosis not present

## 2023-05-05 DIAGNOSIS — R895 Abnormal microbiological findings in specimens from other organs, systems and tissues: Secondary | ICD-10-CM | POA: Diagnosis not present

## 2023-05-07 DIAGNOSIS — R1311 Dysphagia, oral phase: Secondary | ICD-10-CM | POA: Diagnosis not present

## 2023-05-07 DIAGNOSIS — R413 Other amnesia: Secondary | ICD-10-CM | POA: Diagnosis not present

## 2023-05-07 DIAGNOSIS — F0280 Dementia in other diseases classified elsewhere without behavioral disturbance: Secondary | ICD-10-CM | POA: Diagnosis not present

## 2023-05-07 DIAGNOSIS — G252 Other specified forms of tremor: Secondary | ICD-10-CM | POA: Diagnosis not present

## 2023-05-09 DIAGNOSIS — C6931 Malignant neoplasm of right choroid: Secondary | ICD-10-CM | POA: Diagnosis not present

## 2023-05-11 DIAGNOSIS — F0280 Dementia in other diseases classified elsewhere without behavioral disturbance: Secondary | ICD-10-CM | POA: Diagnosis not present

## 2023-05-11 DIAGNOSIS — R1311 Dysphagia, oral phase: Secondary | ICD-10-CM | POA: Diagnosis not present

## 2023-05-16 DIAGNOSIS — F0280 Dementia in other diseases classified elsewhere without behavioral disturbance: Secondary | ICD-10-CM | POA: Diagnosis not present

## 2023-05-16 DIAGNOSIS — R1311 Dysphagia, oral phase: Secondary | ICD-10-CM | POA: Diagnosis not present

## 2023-05-20 DIAGNOSIS — C6931 Malignant neoplasm of right choroid: Secondary | ICD-10-CM | POA: Diagnosis not present

## 2023-05-20 DIAGNOSIS — F0390 Unspecified dementia without behavioral disturbance: Secondary | ICD-10-CM | POA: Diagnosis not present

## 2023-05-22 DIAGNOSIS — F5105 Insomnia due to other mental disorder: Secondary | ICD-10-CM | POA: Diagnosis not present

## 2023-05-22 DIAGNOSIS — F03918 Unspecified dementia, unspecified severity, with other behavioral disturbance: Secondary | ICD-10-CM | POA: Diagnosis not present

## 2023-05-22 DIAGNOSIS — F411 Generalized anxiety disorder: Secondary | ICD-10-CM | POA: Diagnosis not present

## 2023-06-06 DIAGNOSIS — J449 Chronic obstructive pulmonary disease, unspecified: Secondary | ICD-10-CM | POA: Diagnosis not present

## 2023-06-06 DIAGNOSIS — F0390 Unspecified dementia without behavioral disturbance: Secondary | ICD-10-CM | POA: Diagnosis not present

## 2023-06-12 DIAGNOSIS — F4322 Adjustment disorder with anxiety: Secondary | ICD-10-CM | POA: Diagnosis not present

## 2023-06-12 DIAGNOSIS — F03A0 Unspecified dementia, mild, without behavioral disturbance, psychotic disturbance, mood disturbance, and anxiety: Secondary | ICD-10-CM | POA: Diagnosis not present

## 2023-06-19 DIAGNOSIS — R945 Abnormal results of liver function studies: Secondary | ICD-10-CM | POA: Diagnosis not present

## 2023-06-19 DIAGNOSIS — Z130 Encounter for screening for diseases of the blood and blood-forming organs and certain disorders involving the immune mechanism: Secondary | ICD-10-CM | POA: Diagnosis not present

## 2023-06-19 DIAGNOSIS — Z0000 Encounter for general adult medical examination without abnormal findings: Secondary | ICD-10-CM | POA: Diagnosis not present

## 2023-06-19 DIAGNOSIS — E7800 Pure hypercholesterolemia, unspecified: Secondary | ICD-10-CM | POA: Diagnosis not present

## 2023-06-19 DIAGNOSIS — N390 Urinary tract infection, site not specified: Secondary | ICD-10-CM | POA: Diagnosis not present

## 2023-06-27 DIAGNOSIS — C6931 Malignant neoplasm of right choroid: Secondary | ICD-10-CM | POA: Diagnosis not present

## 2023-06-27 DIAGNOSIS — Q111 Other anophthalmos: Secondary | ICD-10-CM | POA: Diagnosis not present

## 2023-07-03 DIAGNOSIS — R109 Unspecified abdominal pain: Secondary | ICD-10-CM | POA: Diagnosis not present

## 2023-07-03 DIAGNOSIS — K5900 Constipation, unspecified: Secondary | ICD-10-CM | POA: Diagnosis not present

## 2023-07-05 DIAGNOSIS — R109 Unspecified abdominal pain: Secondary | ICD-10-CM | POA: Diagnosis not present

## 2023-07-05 DIAGNOSIS — G20A1 Parkinson's disease without dyskinesia, without mention of fluctuations: Secondary | ICD-10-CM | POA: Diagnosis not present

## 2023-07-05 DIAGNOSIS — J449 Chronic obstructive pulmonary disease, unspecified: Secondary | ICD-10-CM | POA: Diagnosis not present

## 2023-07-05 DIAGNOSIS — F03911 Unspecified dementia, unspecified severity, with agitation: Secondary | ICD-10-CM | POA: Diagnosis not present

## 2023-07-09 DIAGNOSIS — R413 Other amnesia: Secondary | ICD-10-CM | POA: Diagnosis not present

## 2023-07-09 DIAGNOSIS — G20A1 Parkinson's disease without dyskinesia, without mention of fluctuations: Secondary | ICD-10-CM | POA: Diagnosis not present

## 2023-07-24 DIAGNOSIS — F03A0 Unspecified dementia, mild, without behavioral disturbance, psychotic disturbance, mood disturbance, and anxiety: Secondary | ICD-10-CM | POA: Diagnosis not present

## 2023-07-24 DIAGNOSIS — F4322 Adjustment disorder with anxiety: Secondary | ICD-10-CM | POA: Diagnosis not present

## 2023-07-25 DIAGNOSIS — K5900 Constipation, unspecified: Secondary | ICD-10-CM | POA: Diagnosis not present

## 2023-07-28 DIAGNOSIS — R000 Tachycardia, unspecified: Secondary | ICD-10-CM | POA: Diagnosis not present

## 2023-07-28 DIAGNOSIS — Z888 Allergy status to other drugs, medicaments and biological substances status: Secondary | ICD-10-CM | POA: Diagnosis not present

## 2023-07-28 DIAGNOSIS — Z87891 Personal history of nicotine dependence: Secondary | ICD-10-CM | POA: Diagnosis not present

## 2023-07-28 DIAGNOSIS — Z743 Need for continuous supervision: Secondary | ICD-10-CM | POA: Diagnosis not present

## 2023-07-28 DIAGNOSIS — R10817 Generalized abdominal tenderness: Secondary | ICD-10-CM | POA: Diagnosis not present

## 2023-07-28 DIAGNOSIS — R188 Other ascites: Secondary | ICD-10-CM | POA: Diagnosis not present

## 2023-07-28 DIAGNOSIS — N1330 Unspecified hydronephrosis: Secondary | ICD-10-CM | POA: Diagnosis not present

## 2023-07-28 DIAGNOSIS — K449 Diaphragmatic hernia without obstruction or gangrene: Secondary | ICD-10-CM | POA: Diagnosis not present

## 2023-07-28 DIAGNOSIS — R410 Disorientation, unspecified: Secondary | ICD-10-CM | POA: Diagnosis not present

## 2023-07-28 DIAGNOSIS — Z881 Allergy status to other antibiotic agents status: Secondary | ICD-10-CM | POA: Diagnosis not present

## 2023-07-28 DIAGNOSIS — R112 Nausea with vomiting, unspecified: Secondary | ICD-10-CM | POA: Diagnosis not present

## 2023-07-28 DIAGNOSIS — R1084 Generalized abdominal pain: Secondary | ICD-10-CM | POA: Diagnosis not present

## 2023-07-28 DIAGNOSIS — R6889 Other general symptoms and signs: Secondary | ICD-10-CM | POA: Diagnosis not present

## 2023-07-28 DIAGNOSIS — R109 Unspecified abdominal pain: Secondary | ICD-10-CM | POA: Diagnosis not present

## 2023-08-06 DIAGNOSIS — Z4421 Encounter for fitting and adjustment of artificial right eye: Secondary | ICD-10-CM | POA: Diagnosis not present

## 2023-08-07 DIAGNOSIS — F4322 Adjustment disorder with anxiety: Secondary | ICD-10-CM | POA: Diagnosis not present

## 2023-08-07 DIAGNOSIS — F03A0 Unspecified dementia, mild, without behavioral disturbance, psychotic disturbance, mood disturbance, and anxiety: Secondary | ICD-10-CM | POA: Diagnosis not present

## 2023-08-08 DIAGNOSIS — F03911 Unspecified dementia, unspecified severity, with agitation: Secondary | ICD-10-CM | POA: Diagnosis not present

## 2023-08-08 DIAGNOSIS — J449 Chronic obstructive pulmonary disease, unspecified: Secondary | ICD-10-CM | POA: Diagnosis not present

## 2023-08-08 DIAGNOSIS — R109 Unspecified abdominal pain: Secondary | ICD-10-CM | POA: Diagnosis not present

## 2023-08-08 DIAGNOSIS — G20A1 Parkinson's disease without dyskinesia, without mention of fluctuations: Secondary | ICD-10-CM | POA: Diagnosis not present

## 2023-08-20 DIAGNOSIS — R4182 Altered mental status, unspecified: Secondary | ICD-10-CM | POA: Diagnosis not present

## 2023-08-21 DIAGNOSIS — I152 Hypertension secondary to endocrine disorders: Secondary | ICD-10-CM | POA: Diagnosis not present

## 2023-08-21 DIAGNOSIS — N400 Enlarged prostate without lower urinary tract symptoms: Secondary | ICD-10-CM | POA: Diagnosis not present

## 2023-08-21 DIAGNOSIS — F03A0 Unspecified dementia, mild, without behavioral disturbance, psychotic disturbance, mood disturbance, and anxiety: Secondary | ICD-10-CM | POA: Diagnosis not present

## 2023-08-21 DIAGNOSIS — J90 Pleural effusion, not elsewhere classified: Secondary | ICD-10-CM | POA: Diagnosis not present

## 2023-08-21 DIAGNOSIS — G20A1 Parkinson's disease without dyskinesia, without mention of fluctuations: Secondary | ICD-10-CM | POA: Diagnosis not present

## 2023-08-21 DIAGNOSIS — R748 Abnormal levels of other serum enzymes: Secondary | ICD-10-CM | POA: Diagnosis not present

## 2023-08-21 DIAGNOSIS — K5900 Constipation, unspecified: Secondary | ICD-10-CM | POA: Diagnosis not present

## 2023-08-21 DIAGNOSIS — F4322 Adjustment disorder with anxiety: Secondary | ICD-10-CM | POA: Diagnosis not present

## 2023-08-21 DIAGNOSIS — Z888 Allergy status to other drugs, medicaments and biological substances status: Secondary | ICD-10-CM | POA: Diagnosis not present

## 2023-08-21 DIAGNOSIS — Z970 Presence of artificial eye: Secondary | ICD-10-CM | POA: Diagnosis not present

## 2023-08-21 DIAGNOSIS — J9811 Atelectasis: Secondary | ICD-10-CM | POA: Diagnosis not present

## 2023-08-21 DIAGNOSIS — J449 Chronic obstructive pulmonary disease, unspecified: Secondary | ICD-10-CM | POA: Diagnosis not present

## 2023-08-21 DIAGNOSIS — R1084 Generalized abdominal pain: Secondary | ICD-10-CM | POA: Diagnosis not present

## 2023-08-21 DIAGNOSIS — R112 Nausea with vomiting, unspecified: Secondary | ICD-10-CM | POA: Diagnosis not present

## 2023-08-21 DIAGNOSIS — C439 Malignant melanoma of skin, unspecified: Secondary | ICD-10-CM | POA: Diagnosis not present

## 2023-08-21 DIAGNOSIS — R251 Tremor, unspecified: Secondary | ICD-10-CM | POA: Diagnosis not present

## 2023-08-21 DIAGNOSIS — R410 Disorientation, unspecified: Secondary | ICD-10-CM | POA: Diagnosis not present

## 2023-08-21 DIAGNOSIS — R339 Retention of urine, unspecified: Secondary | ICD-10-CM | POA: Diagnosis not present

## 2023-08-21 DIAGNOSIS — R109 Unspecified abdominal pain: Secondary | ICD-10-CM | POA: Diagnosis not present

## 2023-08-21 DIAGNOSIS — F0280 Dementia in other diseases classified elsewhere without behavioral disturbance: Secondary | ICD-10-CM | POA: Diagnosis not present

## 2023-08-21 DIAGNOSIS — I10 Essential (primary) hypertension: Secondary | ICD-10-CM | POA: Diagnosis not present

## 2023-08-21 DIAGNOSIS — E785 Hyperlipidemia, unspecified: Secondary | ICD-10-CM | POA: Diagnosis not present

## 2023-08-21 DIAGNOSIS — K769 Liver disease, unspecified: Secondary | ICD-10-CM | POA: Diagnosis not present

## 2023-08-22 DIAGNOSIS — R339 Retention of urine, unspecified: Secondary | ICD-10-CM | POA: Diagnosis not present

## 2023-08-22 DIAGNOSIS — G9340 Encephalopathy, unspecified: Secondary | ICD-10-CM | POA: Diagnosis not present

## 2023-08-22 DIAGNOSIS — Z515 Encounter for palliative care: Secondary | ICD-10-CM | POA: Diagnosis not present

## 2023-08-22 DIAGNOSIS — M6250 Muscle wasting and atrophy, not elsewhere classified, unspecified site: Secondary | ICD-10-CM | POA: Diagnosis not present

## 2023-08-22 DIAGNOSIS — G20A1 Parkinson's disease without dyskinesia, without mention of fluctuations: Secondary | ICD-10-CM | POA: Diagnosis not present

## 2023-08-22 DIAGNOSIS — C6930 Malignant neoplasm of unspecified choroid: Secondary | ICD-10-CM | POA: Diagnosis not present

## 2023-08-22 DIAGNOSIS — F0280 Dementia in other diseases classified elsewhere without behavioral disturbance: Secondary | ICD-10-CM | POA: Diagnosis not present

## 2023-08-22 DIAGNOSIS — R1084 Generalized abdominal pain: Secondary | ICD-10-CM | POA: Diagnosis not present

## 2023-08-22 DIAGNOSIS — I10 Essential (primary) hypertension: Secondary | ICD-10-CM | POA: Diagnosis not present

## 2023-08-22 DIAGNOSIS — G9349 Other encephalopathy: Secondary | ICD-10-CM | POA: Diagnosis not present

## 2023-08-22 DIAGNOSIS — Z888 Allergy status to other drugs, medicaments and biological substances status: Secondary | ICD-10-CM | POA: Diagnosis not present

## 2023-08-22 DIAGNOSIS — R109 Unspecified abdominal pain: Secondary | ICD-10-CM | POA: Diagnosis not present

## 2023-08-22 DIAGNOSIS — C787 Secondary malignant neoplasm of liver and intrahepatic bile duct: Secondary | ICD-10-CM | POA: Diagnosis not present

## 2023-08-22 DIAGNOSIS — R531 Weakness: Secondary | ICD-10-CM | POA: Diagnosis not present

## 2023-08-22 DIAGNOSIS — R079 Chest pain, unspecified: Secondary | ICD-10-CM | POA: Diagnosis not present

## 2023-08-22 DIAGNOSIS — R52 Pain, unspecified: Secondary | ICD-10-CM | POA: Diagnosis not present

## 2023-08-22 DIAGNOSIS — E785 Hyperlipidemia, unspecified: Secondary | ICD-10-CM | POA: Diagnosis not present

## 2023-08-22 DIAGNOSIS — J449 Chronic obstructive pulmonary disease, unspecified: Secondary | ICD-10-CM | POA: Diagnosis not present

## 2023-08-22 DIAGNOSIS — R160 Hepatomegaly, not elsewhere classified: Secondary | ICD-10-CM | POA: Diagnosis not present

## 2023-08-22 DIAGNOSIS — R1011 Right upper quadrant pain: Secondary | ICD-10-CM | POA: Diagnosis not present

## 2023-08-22 DIAGNOSIS — C439 Malignant melanoma of skin, unspecified: Secondary | ICD-10-CM | POA: Diagnosis not present

## 2023-08-22 DIAGNOSIS — R638 Other symptoms and signs concerning food and fluid intake: Secondary | ICD-10-CM | POA: Diagnosis not present

## 2023-08-22 DIAGNOSIS — E440 Moderate protein-calorie malnutrition: Secondary | ICD-10-CM | POA: Diagnosis not present

## 2023-08-22 DIAGNOSIS — F02B4 Dementia in other diseases classified elsewhere, moderate, with anxiety: Secondary | ICD-10-CM | POA: Diagnosis not present

## 2023-08-22 DIAGNOSIS — K769 Liver disease, unspecified: Secondary | ICD-10-CM | POA: Diagnosis not present

## 2023-08-22 DIAGNOSIS — K7689 Other specified diseases of liver: Secondary | ICD-10-CM | POA: Diagnosis not present

## 2023-08-25 DIAGNOSIS — M6250 Muscle wasting and atrophy, not elsewhere classified, unspecified site: Secondary | ICD-10-CM | POA: Diagnosis not present

## 2023-08-25 DIAGNOSIS — Z9989 Dependence on other enabling machines and devices: Secondary | ICD-10-CM | POA: Diagnosis not present

## 2023-08-25 DIAGNOSIS — R52 Pain, unspecified: Secondary | ICD-10-CM | POA: Diagnosis not present

## 2023-08-30 DIAGNOSIS — R079 Chest pain, unspecified: Secondary | ICD-10-CM | POA: Diagnosis not present

## 2023-09-04 DEATH — deceased

## 2023-12-31 IMAGING — CT CT RENAL STONE PROTOCOL
2 of 4 series · 15 of 46 positions shown, 17 images · non-contrast
Comparison: CT abdomen/pelvis 03/01/2018

CLINICAL DATA: Prostate issues, frequency, incomplete emptying,
weak stream. Right flank pain



[Series 2: axial st · axial · 0.72mm/px · z∈[-547,-172]mm · 12 of 87 slices shown, 14 images]
[im 6/87  soft-tissue]
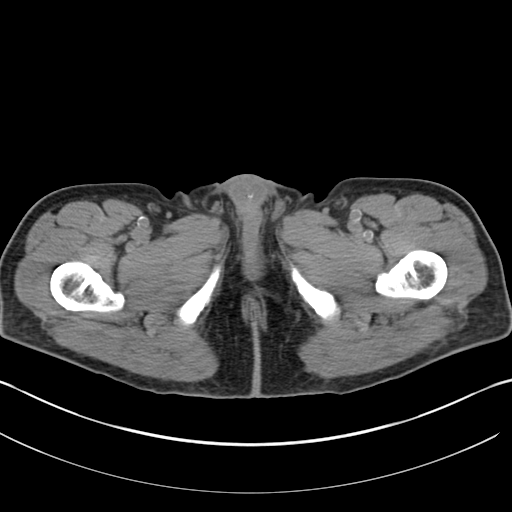
[im 6/87  bone]
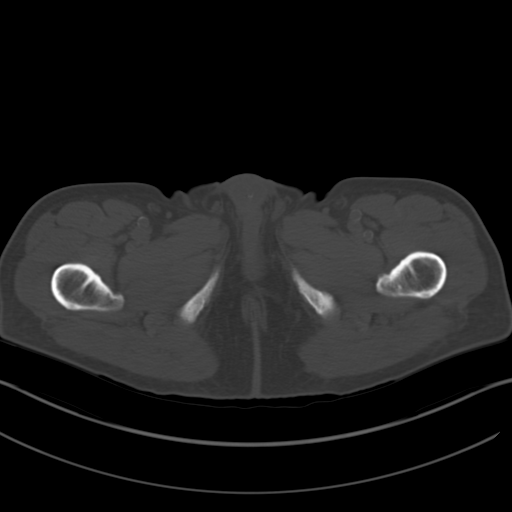
[im 11/87  soft-tissue]
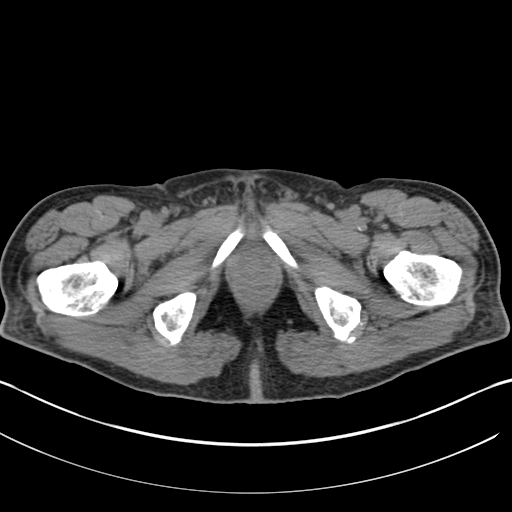
[im 22/87  soft-tissue]
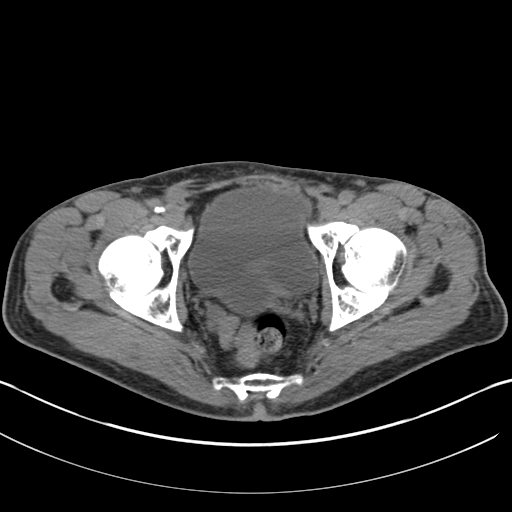
[im 27/87  soft-tissue]
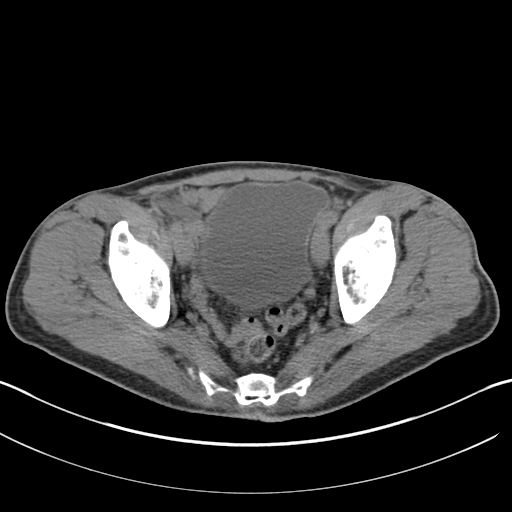
[im 33/87  soft-tissue]
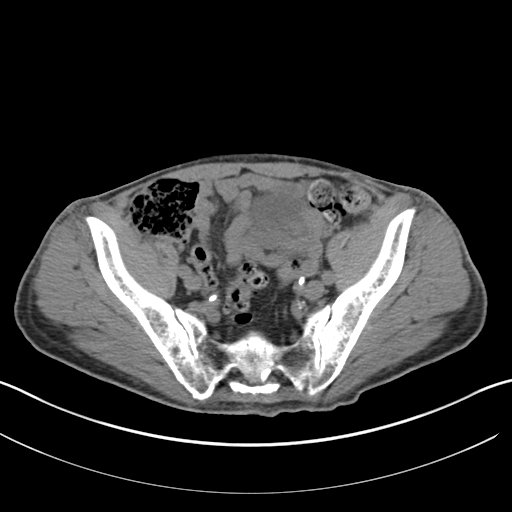
[im 38/87  soft-tissue]
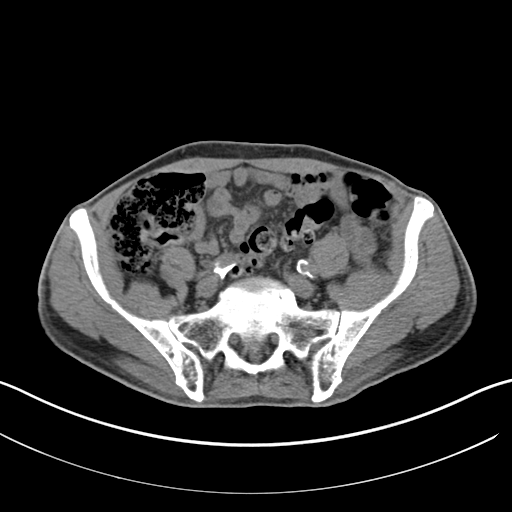
[im 49/87  soft-tissue]
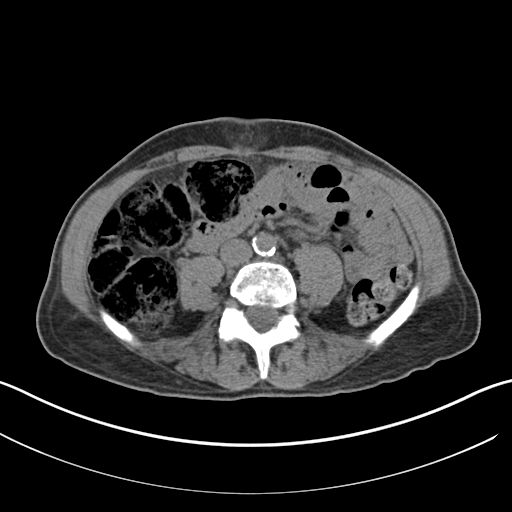
[im 54/87  soft-tissue]
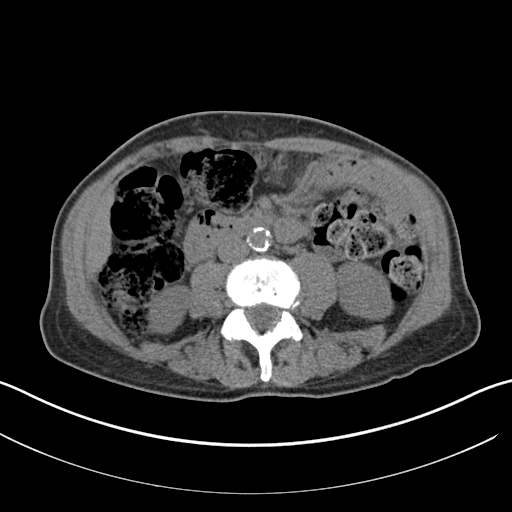
[im 60/87  soft-tissue]
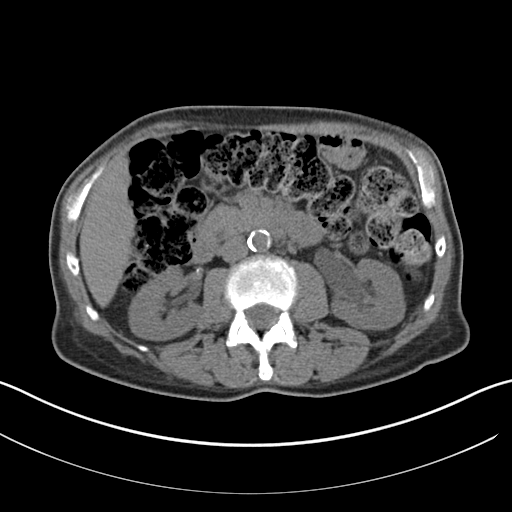
[im 60/87  bone]
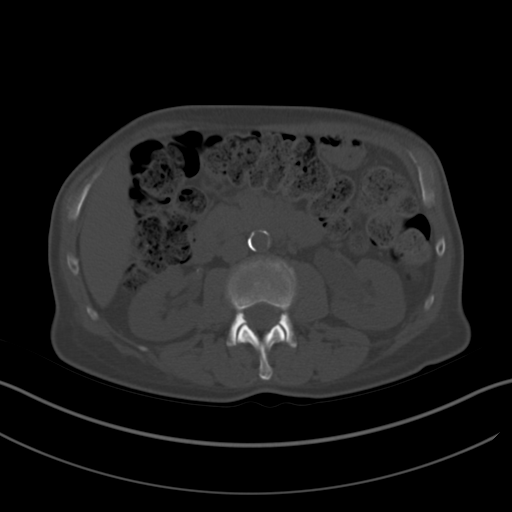
[im 65/87  soft-tissue]
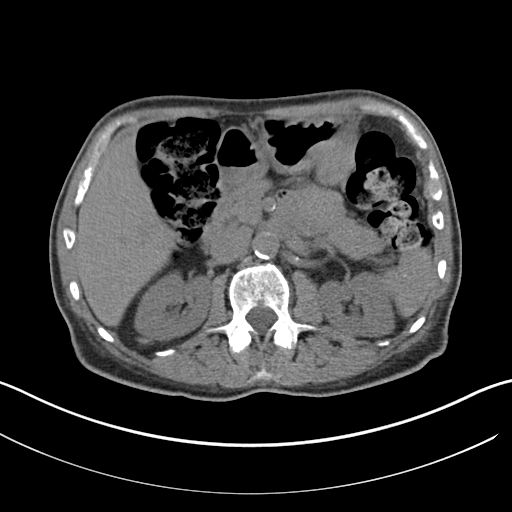
[im 76/87  soft-tissue]
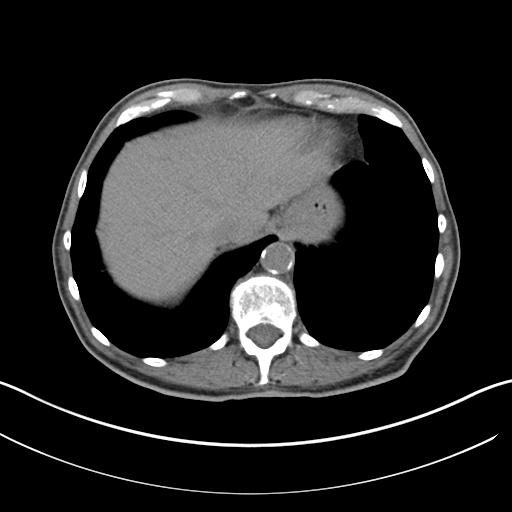
[im 81/87  soft-tissue]
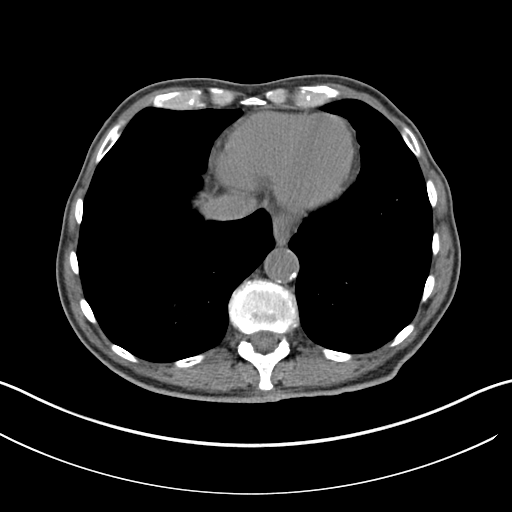

[Series 3: coronal · coronal · 0.75mm/px · 3 of 130 slices shown]
[im 44/130  soft-tissue]
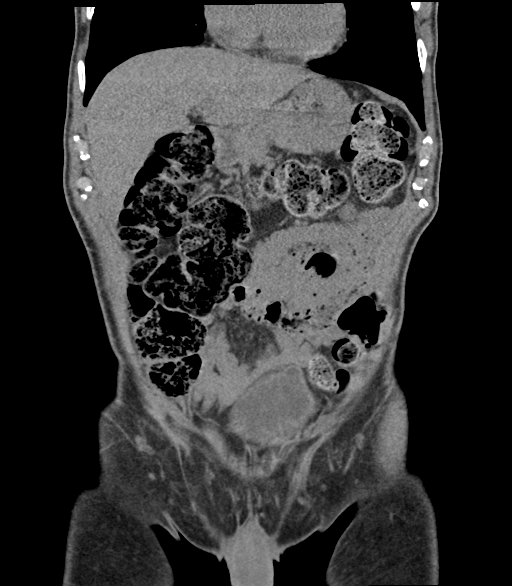
[im 58/130  soft-tissue]
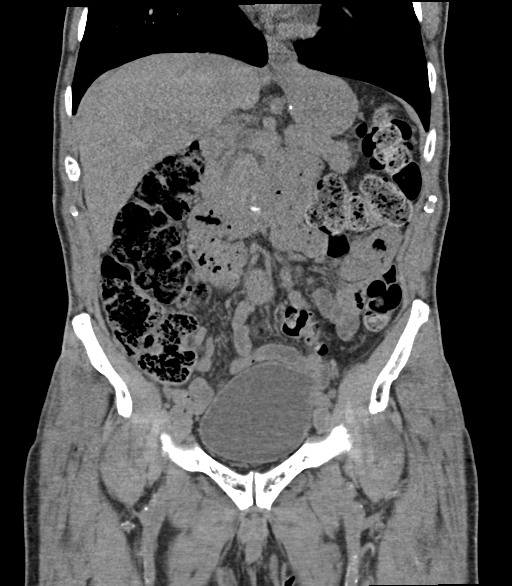
[im 72/130  soft-tissue]
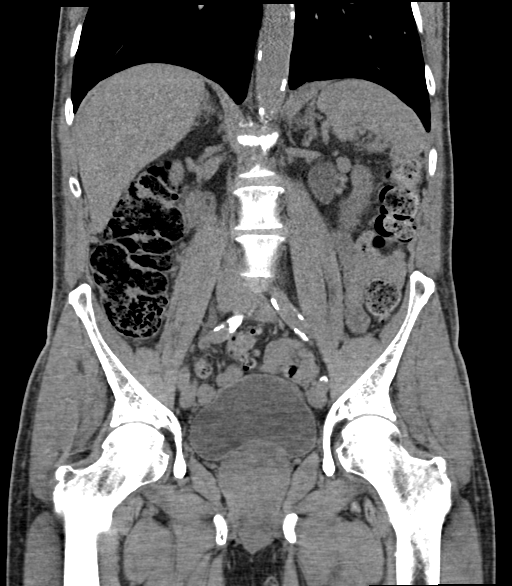

[15 of 46 positions shown; findings below may reference images not displayed]

FINDINGS: Lower chest: The lung bases are clear. The imaged heart is
unremarkable.

Hepatobiliary: The liver is unremarkable, within the confines of
noncontrast technique. The gallbladder is not identified, presumed
surgically absent. There is no biliary ductal dilatation.

Pancreas: Unremarkable, within the confines of noncontrast
technique.

Spleen: There are a few calcified granulomas in the spleen,
unchanged.

Adrenals/Urinary Tract: The adrenals are unremarkable.

There is a subcentimeter focus of hyperdensity in the left lower
pole most likely reflecting small hemorrhagic/proteinaceous cyst.
There are no other focal parenchymal lesions, within the confines of
noncontrast technique. No stones are seen. There is mild left
hydroureteronephrosis with no obstructing lesion or stone seen.
There is evidence of prior surgical reimplantation of the ureter
along the superolateral aspect of the bladder, unchanged. This is
similar to the ultrasound from 11/03/2021, allowing for differences
in modality, and not significantly changed compared to the CT from
03/01/2018. There is no right hydroureteronephrosis. Bladder is
distended with mass effect from the enlarged prostate.

Stomach/Bowel: Stomach is grossly unremarkable. There is no evidence
of bowel obstruction. There is a moderate colonic stool burden.
There is diverticulosis without evidence of acute diverticulitis.
There is no abnormal bowel wall thickening or inflammatory change. A
portion of the appendix is identified and is normal. There is no
pericecal inflammatory change.

Vascular/Lymphatic: There is extensive calcified atherosclerotic
plaque throughout the nonaneurysmal abdominal aorta. There is no
abdominopelvic lymphadenopathy.

Reproductive: The prostate is enlarged measuring up to 5.3 cm
transverse with mass effect on the inferior aspect of the bladder.

Other: There is no ascites or free air. There is a fat containing
ventral abdominal hernia with fat herniated through a 1.7 cm defect
in the anterior abdominal wall musculature.

Musculoskeletal: There is no acute osseous abnormality or aggressive
osseous lesion.
IMPRESSION: 1. Mild left hydroureteronephrosis is similar to the prior CT from
03/01/2018, with possible prior surgical reimplantation of the
ureter into the superolateral aspect of the bladder. No obstructing
stone or lesion is seen. There is no right hydroureteronephrosis.
2. Distended bladder with mass effect along the inferior aspect by
the enlarged prostate. Given the reported history of recent voiding,
this likely reflects outlet obstruction.
3. Moderate colonic stool burden. Diverticulosis without evidence of
acute diverticulitis.
4.  Aortic Atherosclerosis (OFNE3-CYD.D).
